# Patient Record
Sex: Male | Born: 1965
Health system: Southern US, Community
[De-identification: ages and names within clinical notes are randomized; demographics above are authoritative.]

## PROBLEM LIST (undated history)

## (undated) DIAGNOSIS — E785 Hyperlipidemia, unspecified: Secondary | ICD-10-CM

## (undated) DIAGNOSIS — I10 Essential (primary) hypertension: Secondary | ICD-10-CM

---

## 2003-08-17 ENCOUNTER — Emergency Department (HOSPITAL_COMMUNITY): Admission: AD | Admit: 2003-08-17 | Discharge: 2003-08-17 | Payer: Self-pay

## 2004-08-06 ENCOUNTER — Emergency Department (HOSPITAL_COMMUNITY): Admission: EM | Admit: 2004-08-06 | Discharge: 2004-08-07 | Payer: Self-pay | Admitting: Emergency Medicine

## 2005-06-29 ENCOUNTER — Emergency Department (HOSPITAL_COMMUNITY): Admission: EM | Admit: 2005-06-29 | Discharge: 2005-06-29 | Payer: Self-pay | Admitting: Emergency Medicine

## 2005-09-17 ENCOUNTER — Emergency Department (HOSPITAL_COMMUNITY): Admission: EM | Admit: 2005-09-17 | Discharge: 2005-09-17 | Payer: Self-pay | Admitting: Emergency Medicine

## 2005-09-22 ENCOUNTER — Emergency Department (HOSPITAL_COMMUNITY): Admission: EM | Admit: 2005-09-22 | Discharge: 2005-09-22 | Payer: Self-pay | Admitting: Family Medicine

## 2005-09-26 ENCOUNTER — Emergency Department (HOSPITAL_COMMUNITY): Admission: EM | Admit: 2005-09-26 | Discharge: 2005-09-26 | Payer: Self-pay | Admitting: Family Medicine

## 2006-09-27 ENCOUNTER — Emergency Department (HOSPITAL_COMMUNITY): Admission: EM | Admit: 2006-09-27 | Discharge: 2006-09-27 | Payer: Self-pay | Admitting: Emergency Medicine

## 2009-11-23 ENCOUNTER — Emergency Department (HOSPITAL_COMMUNITY): Admission: EM | Admit: 2009-11-23 | Discharge: 2009-11-23 | Payer: Self-pay | Admitting: Emergency Medicine

## 2010-02-15 ENCOUNTER — Encounter: Admission: RE | Admit: 2010-02-15 | Discharge: 2010-03-09 | Payer: Self-pay | Admitting: *Deleted

## 2011-06-29 ENCOUNTER — Inpatient Hospital Stay (INDEPENDENT_AMBULATORY_CARE_PROVIDER_SITE_OTHER)
Admission: RE | Admit: 2011-06-29 | Discharge: 2011-06-29 | Disposition: A | Discharge status: Home / Self Care | Payer: No Typology Code available for payment source | Source: Ambulatory Visit | Attending: Emergency Medicine | Admitting: Emergency Medicine

## 2011-06-29 ENCOUNTER — Ambulatory Visit (INDEPENDENT_AMBULATORY_CARE_PROVIDER_SITE_OTHER): Payer: No Typology Code available for payment source

## 2011-06-29 DIAGNOSIS — R071 Chest pain on breathing: Secondary | ICD-10-CM

## 2011-10-08 ENCOUNTER — Emergency Department (HOSPITAL_COMMUNITY): Payer: Worker's Compensation

## 2011-10-08 ENCOUNTER — Emergency Department (HOSPITAL_COMMUNITY)
Admission: EM | Admit: 2011-10-08 | Discharge: 2011-10-08 | Disposition: A | Discharge status: Home / Self Care | Payer: Worker's Compensation | Attending: Emergency Medicine | Admitting: Emergency Medicine

## 2011-10-08 ENCOUNTER — Encounter (HOSPITAL_COMMUNITY): Payer: Self-pay | Admitting: *Deleted

## 2011-10-08 DIAGNOSIS — Z79899 Other long term (current) drug therapy: Secondary | ICD-10-CM | POA: Insufficient documentation

## 2011-10-08 DIAGNOSIS — M545 Low back pain, unspecified: Secondary | ICD-10-CM | POA: Insufficient documentation

## 2011-10-08 DIAGNOSIS — S20229A Contusion of unspecified back wall of thorax, initial encounter: Secondary | ICD-10-CM | POA: Insufficient documentation

## 2011-10-08 DIAGNOSIS — W11XXXA Fall on and from ladder, initial encounter: Secondary | ICD-10-CM | POA: Insufficient documentation

## 2011-10-08 DIAGNOSIS — M533 Sacrococcygeal disorders, not elsewhere classified: Secondary | ICD-10-CM | POA: Insufficient documentation

## 2011-10-08 DIAGNOSIS — I10 Essential (primary) hypertension: Secondary | ICD-10-CM | POA: Insufficient documentation

## 2011-10-08 DIAGNOSIS — S300XXA Contusion of lower back and pelvis, initial encounter: Secondary | ICD-10-CM

## 2011-10-08 DIAGNOSIS — E119 Type 2 diabetes mellitus without complications: Secondary | ICD-10-CM | POA: Insufficient documentation

## 2011-10-08 HISTORY — DX: Essential (primary) hypertension: I10

## 2011-10-08 MED ORDER — IBUPROFEN 200 MG PO TABS
400.0000 mg | ORAL_TABLET | Freq: Once | ORAL | Status: AC
  Administered 2011-10-08: 400 mg via ORAL
  Filled 2011-10-08: qty 2

## 2011-10-08 NOTE — ED Notes (Signed)
Pt fell at work off a train. Tailbone landed on hard metal. This happened today at 1400. 7/10 pain. Sitting decreases pain. Pt fell 3 ft.

## 2011-10-08 NOTE — ED Notes (Signed)
He fell from the trian at work this afternoon and he now has tailbone pain

## 2011-10-08 NOTE — ED Provider Notes (Signed)
History    46 year old male with lower back pain. The patient was stepping down from a ladder off a train when he slipped and fell onto his back. Fell onto his buttock and lower back. Persistent pain in this area since. Has been ambulatory Pain is worse with ambulation. No numbness, weakness or tingling. No bladder or bowel retention or incontinence. Did not hit head. Denies headache. No neck pain. No other complaints.  CSN: [id number]  Arrival date & time 10/08/11  1609   First MD Initiated Contact with Patient 10/08/11 1715      Chief Complaint  Patient presents with  . Fall    (Consider location/radiation/quality/duration/timing/severity/associated sxs/prior treatment) HPI  Past Medical History  Diagnosis Date  . Hypertension   . Diabetes mellitus     History reviewed. No pertinent past surgical history.  History reviewed. No pertinent family history.  History  Substance Use Topics  . Smoking status: Never Smoker   . Smokeless tobacco: Not on file  . Alcohol Use: Yes      Review of Systems   Review of symptoms negative unless otherwise noted in HPI.   Allergies  Review of patient's allergies indicates no known allergies.  Home Medications   Current Outpatient Rx  Name Route Sig Dispense Refill  . LISINOPRIL-HYDROCHLOROTHIAZIDE 20-25 MG PO TABS Oral Take 1 tablet by mouth daily.    Marland Kitchen METFORMIN HCL ER (MOD) 500 MG PO TB24 Oral Take 500 mg by mouth daily with breakfast.    . SIMVASTATIN 20 MG PO TABS Oral Take 20 mg by mouth daily.      BP 133/93  Pulse 81  Temp(Src) 98.1 F (36.7 C) (Oral)  Resp 18  SpO2 96%  Physical Exam  Nursing note and vitals reviewed. Constitutional: He is oriented to person, place, and time. He appears well-developed and well-nourished. No distress.       Sitting in chair. No acute distress  HENT:  Head: Normocephalic and atraumatic.  Eyes: Conjunctivae are normal. Right eye exhibits no discharge. Left eye exhibits no  discharge.  Neck: Neck supple.  Cardiovascular: Normal rate, regular rhythm and normal heart sounds.  Exam reveals no gallop and no friction rub.   No murmur heard. Pulmonary/Chest: Effort normal and breath sounds normal. No respiratory distress.  Abdominal: Soft. He exhibits no distension. There is no tenderness.  Musculoskeletal: He exhibits tenderness. He exhibits no edema.       Mild-to-moderate lower lumbar and sacral tenderness.  Neurological: He is alert and oriented to person, place, and time. No cranial nerve deficit. He exhibits normal muscle tone. Coordination normal.       Normal-appearing gait. Deep tendon reflexes of the patellar normal bilaterally.  Skin: Skin is warm and dry.  Psychiatric: He has a normal mood and affect. His behavior is normal. Thought content normal.    ED Course  Procedures (including critical care time)  Labs Reviewed - No data to display Dg Lumbar Spine Complete  10/08/2011  *RADIOLOGY REPORT*  Clinical Data: Fall from a train.  Back pain.  LUMBAR SPINE - COMPLETE 4+ VIEW  Comparison: None.  Findings: Five non-rib bearing lumbar type vertebral bodies are present.  The vertebral body heights are maintained.  Loss of disc height at L5-S1 is chronic with slight retrolisthesis.  No acute fracture or traumatic subluxation is evident.  IMPRESSION:  1.  Mild chronic degenerative change at L5-S1. 2.  No acute abnormality.  Original Report Authenticated By: Jamesetta Orleans. MATTERN, M.D.  Dg Sacrum/coccyx  10/08/2011  *RADIOLOGY REPORT*  Clinical Data: Fall from a train.  Sacrum and coccyx pain.  SACRUM AND COCCYX - 2+ VIEW  Comparison: Lumbar radiographs from the same date.  Findings: Mild degenerative changes are again noted at L5-S1.  The sacrum is intact.  No acute abnormality is present.  IMPRESSION:  1.  No acute abnormality. 2.  Chronic degenerative changes at L5-S1.  Original Report Authenticated By: Jamesetta Orleans. MATTERN, M.D.     1. Contusion of lower  back       MDM  46 year old male with back pain status post fall. consider fracture, contusion or sprain. X-rays withno evidence of fracture. Neurological examination is nonfocal. Consistent with contusion. Plan symptomatic treatment. Outpatient follow up as needed.        Raeford Razor, MD 10/08/11 [phone]

## 2012-05-29 ENCOUNTER — Ambulatory Visit: Payer: Medicaid Other | Attending: Specialist | Admitting: Rehabilitative and Restorative Service Providers"

## 2012-05-29 DIAGNOSIS — M2569 Stiffness of other specified joint, not elsewhere classified: Secondary | ICD-10-CM | POA: Insufficient documentation

## 2012-05-29 DIAGNOSIS — M545 Low back pain, unspecified: Secondary | ICD-10-CM | POA: Insufficient documentation

## 2012-05-29 DIAGNOSIS — R293 Abnormal posture: Secondary | ICD-10-CM | POA: Insufficient documentation

## 2012-05-29 DIAGNOSIS — IMO0001 Reserved for inherently not codable concepts without codable children: Secondary | ICD-10-CM | POA: Insufficient documentation

## 2012-06-05 ENCOUNTER — Ambulatory Visit: Payer: Medicaid Other | Admitting: Rehabilitation

## 2012-06-10 ENCOUNTER — Ambulatory Visit: Payer: Medicaid Other | Attending: Specialist | Admitting: Rehabilitative and Restorative Service Providers"

## 2012-06-10 DIAGNOSIS — R293 Abnormal posture: Secondary | ICD-10-CM | POA: Insufficient documentation

## 2012-06-10 DIAGNOSIS — M545 Low back pain, unspecified: Secondary | ICD-10-CM | POA: Insufficient documentation

## 2012-06-10 DIAGNOSIS — IMO0001 Reserved for inherently not codable concepts without codable children: Secondary | ICD-10-CM | POA: Insufficient documentation

## 2012-06-10 DIAGNOSIS — M2569 Stiffness of other specified joint, not elsewhere classified: Secondary | ICD-10-CM | POA: Insufficient documentation

## 2012-06-18 ENCOUNTER — Ambulatory Visit: Payer: Medicaid Other | Admitting: Rehabilitative and Restorative Service Providers"

## 2012-12-22 ENCOUNTER — Emergency Department (HOSPITAL_COMMUNITY)
Admission: EM | Admit: 2012-12-22 | Discharge: 2012-12-23 | Disposition: A | Discharge status: Home / Self Care | Payer: Medicaid Other | Attending: Emergency Medicine | Admitting: Emergency Medicine

## 2012-12-22 ENCOUNTER — Encounter (HOSPITAL_COMMUNITY): Payer: Self-pay | Admitting: Emergency Medicine

## 2012-12-22 DIAGNOSIS — R739 Hyperglycemia, unspecified: Secondary | ICD-10-CM

## 2012-12-22 DIAGNOSIS — R748 Abnormal levels of other serum enzymes: Secondary | ICD-10-CM | POA: Insufficient documentation

## 2012-12-22 DIAGNOSIS — Z91199 Patient's noncompliance with other medical treatment and regimen due to unspecified reason: Secondary | ICD-10-CM | POA: Insufficient documentation

## 2012-12-22 DIAGNOSIS — E1169 Type 2 diabetes mellitus with other specified complication: Secondary | ICD-10-CM | POA: Insufficient documentation

## 2012-12-22 DIAGNOSIS — R631 Polydipsia: Secondary | ICD-10-CM | POA: Insufficient documentation

## 2012-12-22 DIAGNOSIS — Z79899 Other long term (current) drug therapy: Secondary | ICD-10-CM | POA: Insufficient documentation

## 2012-12-22 DIAGNOSIS — E669 Obesity, unspecified: Secondary | ICD-10-CM | POA: Insufficient documentation

## 2012-12-22 DIAGNOSIS — R358 Other polyuria: Secondary | ICD-10-CM | POA: Insufficient documentation

## 2012-12-22 DIAGNOSIS — R3589 Other polyuria: Secondary | ICD-10-CM | POA: Insufficient documentation

## 2012-12-22 DIAGNOSIS — I10 Essential (primary) hypertension: Secondary | ICD-10-CM | POA: Insufficient documentation

## 2012-12-22 DIAGNOSIS — Z9119 Patient's noncompliance with other medical treatment and regimen: Secondary | ICD-10-CM | POA: Insufficient documentation

## 2012-12-22 DIAGNOSIS — R632 Polyphagia: Secondary | ICD-10-CM | POA: Insufficient documentation

## 2012-12-22 LAB — COMPREHENSIVE METABOLIC PANEL
ALT: 124 U/L — ABNORMAL HIGH (ref 0–53)
AST: 140 U/L — ABNORMAL HIGH (ref 0–37)
Albumin: 3.8 g/dL (ref 3.5–5.2)
Alkaline Phosphatase: 46 U/L (ref 39–117)
BUN: 19 mg/dL (ref 6–23)
CO2: 20 mEq/L (ref 19–32)
Calcium: 9.5 mg/dL (ref 8.4–10.5)
Chloride: 91 mEq/L — ABNORMAL LOW (ref 96–112)
Creatinine, Ser: 0.7 mg/dL (ref 0.50–1.35)
GFR calc Af Amer: >90 mL/min (ref >90)
GFR calc non Af Amer: >90 mL/min (ref >90)
Glucose, Bld: 353 mg/dL — ABNORMAL HIGH (ref 70–99)
Potassium: 5 mEq/L (ref 3.5–5.1)
Sodium: 132 mEq/L — ABNORMAL LOW (ref 135–145)
Total Bilirubin: 0.7 mg/dL (ref 0.3–1.2)
Total Protein: 7.3 g/dL (ref 6.0–8.3)

## 2012-12-22 LAB — CBC WITH DIFFERENTIAL/PLATELET
Basophils Absolute: 0.1 10*3/uL (ref 0.0–0.1)
Basophils Relative: 1 % (ref 0–1)
Eosinophils Absolute: 0.1 10*3/uL (ref 0.0–0.7)
Eosinophils Relative: 1 % (ref 0–5)
HCT: 43.9 % (ref 39.0–52.0)
Hemoglobin: 16.6 g/dL (ref 13.0–17.0)
Lymphocytes Relative: 24 % (ref 12–46)
Lymphs Abs: 1.4 10*3/uL (ref 0.7–4.0)
MCH: 31 pg (ref 26.0–34.0)
MCHC: 37.8 g/dL — ABNORMAL HIGH (ref 30.0–36.0)
MCV: 81.9 fL (ref 78.0–100.0)
Monocytes Absolute: 0.6 10*3/uL (ref 0.1–1.0)
Monocytes Relative: 10 % (ref 3–12)
Neutro Abs: 3.9 10*3/uL (ref 1.7–7.7)
Neutrophils Relative %: 64 % (ref 43–77)
Platelets: 216 10*3/uL (ref 150–400)
RBC: 5.36 MIL/uL (ref 4.22–5.81)
RDW: 13.2 % (ref 11.5–15.5)
WBC: 6 10*3/uL (ref 4.0–10.5)

## 2012-12-22 LAB — GLUCOSE, CAPILLARY
Glucose-Capillary: 431 mg/dL — ABNORMAL HIGH (ref 70–99)
Glucose-Capillary: 316 mg/dL — ABNORMAL HIGH (ref 70–99)

## 2012-12-22 MED ORDER — GLYBURIDE 5 MG PO TABS: 5.0000 mg | ORAL_TABLET | Freq: Every day | ORAL | Status: DC

## 2012-12-22 MED ORDER — SODIUM CHLORIDE 0.9 % IV BOLUS (SEPSIS)
2000.0000 mL | Freq: Once | INTRAVENOUS | Status: AC
  Administered 2012-12-22: 2000 mL via INTRAVENOUS

## 2012-12-22 NOTE — ED Provider Notes (Signed)
History     CSN: [id number]  Arrival date & time 12/22/12  1742   First MD Initiated Contact with Patient 12/22/12 2133      Chief Complaint  Patient presents with  . Hyperglycemia    (Consider location/radiation/quality/duration/timing/severity/associated sxs/prior treatment) HPI   Vincent Mcintyre is a 47 y.o. male sent from his primary care physician's office for evaluation of elevated glucose. Patient went for a exam this morning had blood drawn and was found to have a glucose of over 600. Patient is noncompliant with his metformin which he has not had an ovary here because he's been unemployed and does not have money. Patient denies fever, chest pain, shortness of breath, abdominal pain, nausea vomiting, dysuria. Patient endorses polyuria, polydipsia, polyphagia.    Past Medical History  Diagnosis Date  . Hypertension   . Diabetes mellitus     History reviewed. No pertinent past surgical history.  History reviewed. No pertinent family history.  History  Substance Use Topics  . Smoking status: Never Smoker   . Smokeless tobacco: Not on file  . Alcohol Use: Yes      Review of Systems  Constitutional: Negative for fever.  Respiratory: Negative for shortness of breath.   Cardiovascular: Negative for chest pain.  Gastrointestinal: Negative for nausea, vomiting, abdominal pain and diarrhea.  Endocrine: Positive for polydipsia, polyphagia and polyuria.  All other systems reviewed and are negative.    Allergies  Review of patient's allergies indicates no known allergies.  Home Medications   Current Outpatient Rx  Name  Route  Sig  Dispense  Refill  . lisinopril-hydrochlorothiazide (PRINZIDE,ZESTORETIC) 20-25 MG per tablet   Oral   Take 1 tablet by mouth daily.         . metFORMIN (GLUMETZA) 500 MG (MOD) 24 hr tablet   Oral   Take 500 mg by mouth daily with breakfast.         . simvastatin (ZOCOR) 20 MG tablet   Oral   Take 20 mg by mouth daily.            BP 144/98  Pulse 110  Temp(Src) 98.7 F (37.1 C) (Oral)  Resp 20  SpO2 95%  Physical Exam  Nursing note and vitals reviewed. Constitutional: He is oriented to person, place, and time. He appears well-developed and well-nourished. No distress.  Obese   HENT:  Head: Normocephalic and atraumatic.  Mouth/Throat: Oropharynx is clear and moist.  Eyes: Conjunctivae and EOM are normal. Pupils are equal, round, and reactive to light.  Neck: Normal range of motion.  Cardiovascular: Normal rate, regular rhythm and intact distal pulses.   Pulmonary/Chest: Effort normal and breath sounds normal. No stridor. No respiratory distress. He has no wheezes. He has no rales. He exhibits no tenderness.  Abdominal: Soft. Bowel sounds are normal. He exhibits no distension and no mass. There is no tenderness. There is no rebound and no guarding.  Musculoskeletal: Normal range of motion.  Neurological: He is alert and oriented to person, place, and time.  Psychiatric: He has a normal mood and affect.    ED Course  Procedures (including critical care time)  Labs Reviewed  GLUCOSE, CAPILLARY - Abnormal; Notable for the following:    Glucose-Capillary 431 (*)    All other components within normal limits  CBC WITH DIFFERENTIAL - Abnormal; Notable for the following:    MCHC 37.8 (*)    All other components within normal limits  COMPREHENSIVE METABOLIC PANEL - Abnormal; Notable for the following:  Sodium 132 (*)    Chloride 91 (*)    Glucose, Bld 353 (*)    AST 140 (*)    ALT 124 (*)    All other components within normal limits  GLUCOSE, CAPILLARY - Abnormal; Notable for the following:    Glucose-Capillary 316 (*)    All other components within normal limits  CBC WITH DIFFERENTIAL   No results found.   1. Hyperglycemia without ketosis   2. Elevated liver function tests       MDM   Vincent Mcintyre is a 47 y.o. male sent from his primary care physician's office for elevated glucose.  CBG is 431 with no anion gap. Patient is given 2 L of normal saline and CBG reduces to 316. Patient has elevated LFTs at 140 and 124. For this reason I and taking him off of metformin and starting him on glipizide.  VSS, patient is appropriate for an amenable to discharge at this time.   Filed Vitals:   12/22/12 1817 12/23/12 0009  BP: 144/98 147/94  Pulse: 110 89  Temp: 98.7 F (37.1 C) 97.7 F (36.5 C)  TempSrc: Oral Oral  Resp: 20 18  SpO2: 95% 98%     Pt verbalized understanding and agrees with care plan. Outpatient follow-up and return precautions given.    Discharge Medication List as of 12/22/2012 11:44 PM    START taking these medications   Details  glyBURIDE (DIABETA) 5 MG tablet Take 1 tablet (5 mg total) by mouth daily with breakfast., Starting 12/22/2012, Until Discontinued, [employer], PA-C 12/25/12 [phone]

## 2012-12-22 NOTE — ED Notes (Signed)
Pt sent here from PCP for eval for hyperglycemia; pt with hx of DM and denies complaint

## 2012-12-23 NOTE — ED Notes (Signed)
Pt discharged.Vital signs stable and GCS 15 

## 2012-12-25 NOTE — ED Provider Notes (Signed)
Medical screening examination/treatment/procedure(s) were performed by non-physician practitioner and as supervising physician I was immediately available for consultation/collaboration.   Gwyneth Sprout, MD 12/25/12 [phone]

## 2013-04-22 ENCOUNTER — Other Ambulatory Visit: Payer: Self-pay | Admitting: Family Medicine

## 2013-04-22 DIAGNOSIS — M792 Neuralgia and neuritis, unspecified: Secondary | ICD-10-CM

## 2013-04-27 ENCOUNTER — Ambulatory Visit
Admission: RE | Admit: 2013-04-27 | Discharge: 2013-04-27 | Disposition: A | Discharge status: Home / Self Care | Payer: Medicaid Other | Source: Ambulatory Visit | Attending: Family Medicine | Admitting: Family Medicine

## 2013-04-27 DIAGNOSIS — M792 Neuralgia and neuritis, unspecified: Secondary | ICD-10-CM

## 2014-10-15 ENCOUNTER — Other Ambulatory Visit: Payer: Self-pay | Admitting: Neurological Surgery

## 2014-10-15 DIAGNOSIS — M545 Low back pain: Secondary | ICD-10-CM

## 2014-10-25 ENCOUNTER — Other Ambulatory Visit: Payer: Medicaid Other

## 2014-10-28 ENCOUNTER — Ambulatory Visit
Admission: RE | Admit: 2014-10-28 | Discharge: 2014-10-28 | Disposition: A | Discharge status: Home / Self Care | Payer: Medicaid Other | Source: Ambulatory Visit | Attending: Neurological Surgery | Admitting: Neurological Surgery

## 2014-10-28 DIAGNOSIS — M545 Low back pain: Secondary | ICD-10-CM

## 2014-11-25 ENCOUNTER — Encounter (HOSPITAL_COMMUNITY): Payer: Self-pay | Admitting: Emergency Medicine

## 2014-11-25 ENCOUNTER — Emergency Department (HOSPITAL_COMMUNITY)
Admission: EM | Admit: 2014-11-25 | Discharge: 2014-11-26 | Disposition: A | Discharge status: Home / Self Care | Payer: Medicaid Other | Attending: Emergency Medicine | Admitting: Emergency Medicine

## 2014-11-25 ENCOUNTER — Emergency Department (HOSPITAL_COMMUNITY): Payer: Medicaid Other

## 2014-11-25 DIAGNOSIS — I10 Essential (primary) hypertension: Secondary | ICD-10-CM | POA: Insufficient documentation

## 2014-11-25 DIAGNOSIS — K602 Anal fissure, unspecified: Secondary | ICD-10-CM | POA: Insufficient documentation

## 2014-11-25 DIAGNOSIS — Z79899 Other long term (current) drug therapy: Secondary | ICD-10-CM | POA: Diagnosis not present

## 2014-11-25 DIAGNOSIS — R195 Other fecal abnormalities: Secondary | ICD-10-CM

## 2014-11-25 DIAGNOSIS — E785 Hyperlipidemia, unspecified: Secondary | ICD-10-CM | POA: Diagnosis not present

## 2014-11-25 DIAGNOSIS — K921 Melena: Secondary | ICD-10-CM | POA: Diagnosis not present

## 2014-11-25 DIAGNOSIS — E119 Type 2 diabetes mellitus without complications: Secondary | ICD-10-CM | POA: Diagnosis not present

## 2014-11-25 DIAGNOSIS — K6289 Other specified diseases of anus and rectum: Secondary | ICD-10-CM

## 2014-11-25 LAB — CBC WITH DIFFERENTIAL/PLATELET
Basophils Absolute: 0 10*3/uL (ref 0.0–0.1)
Basophils Relative: 0 % (ref 0–1)
Eosinophils Absolute: 0 10*3/uL (ref 0.0–0.7)
Eosinophils Relative: 0 % (ref 0–5)
HCT: 45.2 % (ref 39.0–52.0)
HEMOGLOBIN: 16.3 g/dL (ref 13.0–17.0)
Lymphocytes Relative: 15 % (ref 12–46)
Lymphs Abs: 1.9 10*3/uL (ref 0.7–4.0)
MCH: 30 pg (ref 26.0–34.0)
MCHC: 36.1 g/dL — AB (ref 30.0–36.0)
MCV: 83.2 fL (ref 78.0–100.0)
MONO ABS: 1 10*3/uL (ref 0.1–1.0)
MONOS PCT: 8 % (ref 3–12)
NEUTROS PCT: 77 % (ref 43–77)
Neutro Abs: 9.8 10*3/uL — ABNORMAL HIGH (ref 1.7–7.7)
PLATELETS: 214 10*3/uL (ref 150–400)
RBC: 5.43 MIL/uL (ref 4.22–5.81)
RDW: 13 % (ref 11.5–15.5)
WBC: 12.7 10*3/uL — ABNORMAL HIGH (ref 4.0–10.5)

## 2014-11-25 LAB — COMPREHENSIVE METABOLIC PANEL
ALT: 47 U/L (ref 0–53)
ANION GAP: 11 (ref 5–15)
AST: 35 U/L (ref 0–37)
Albumin: 3.9 g/dL (ref 3.5–5.2)
Alkaline Phosphatase: 44 U/L (ref 39–117)
BUN: 25 mg/dL — ABNORMAL HIGH (ref 6–23)
CALCIUM: 9.3 mg/dL (ref 8.4–10.5)
CO2: 26 mmol/L (ref 19–32)
Chloride: 96 mmol/L (ref 96–112)
Creatinine, Ser: 1.1 mg/dL (ref 0.50–1.35)
GFR calc Af Amer: >90 mL/min (ref >90)
GFR, EST NON AFRICAN AMERICAN: 78 mL/min — AB (ref >90)
GLUCOSE: 309 mg/dL — AB (ref 70–99)
Potassium: 4 mmol/L (ref 3.5–5.1)
Sodium: 133 mmol/L — ABNORMAL LOW (ref 135–145)
Total Bilirubin: 1.4 mg/dL — ABNORMAL HIGH (ref 0.3–1.2)
Total Protein: 6.9 g/dL (ref 6.0–8.3)

## 2014-11-25 LAB — POC OCCULT BLOOD, ED: Fecal Occult Bld: POSITIVE — AB

## 2014-11-25 MED ORDER — HYDROCODONE-ACETAMINOPHEN 5-325 MG PO TABS
1.0000 | ORAL_TABLET | Freq: Once | ORAL | Status: AC
  Administered 2014-11-25: 1 via ORAL
  Filled 2014-11-25: qty 1

## 2014-11-25 MED ORDER — IOHEXOL 300 MG/ML  SOLN
25.0000 mL | INTRAMUSCULAR | Status: AC
  Administered 2014-11-25: 25 mL via ORAL

## 2014-11-25 MED ORDER — SODIUM CHLORIDE 0.9 % IV BOLUS (SEPSIS)
1000.0000 mL | Freq: Once | INTRAVENOUS | Status: AC
  Administered 2014-11-25: 1000 mL via INTRAVENOUS

## 2014-11-25 MED ORDER — IOHEXOL 300 MG/ML  SOLN
100.0000 mL | Freq: Once | INTRAMUSCULAR | Status: AC | PRN
  Administered 2014-11-25: 100 mL via INTRAVENOUS

## 2014-11-25 NOTE — ED Notes (Signed)
Denies any large amount of blood in stool, but states some pink at times.

## 2014-11-25 NOTE — ED Provider Notes (Signed)
CSN: [id number]     Arrival date & time 11/25/14  1914 History   First MD Initiated Contact with Patient 11/25/14 2143     Chief Complaint  Patient presents with  . Abscess  . Rectal Pain     (Consider location/radiation/quality/duration/timing/severity/associated sxs/prior Treatment) HPI  Vincent Mcintyre is a 49 y.o. male with PMH of hypertension, hyperlipidemia, diabetes presenting with 2-3 day history of rectal pain with defecation as well as some pink per rectum but no frank blood or melanotic stool. Patient was seen by [facility] physicians advised to come to the ED to rule out perirectal abscess. Patient endorses chills and has low-grade temp of 99.8 denies any nausea or vomiting. No abdominal pain. He has been using fiber supplementation as well as sitz bath with some improvement as well as Aleve. No CP or SOB.   Past Medical History  Diagnosis Date  . Hypertension   . Diabetes mellitus    History reviewed. No pertinent past surgical history. History reviewed. No pertinent family history. History  Substance Use Topics  . Smoking status: Never Smoker   . Smokeless tobacco: Not on file  . Alcohol Use: Yes    Review of Systems 10 Systems reviewed and are negative for acute change except as noted in the HPI.    Allergies  Review of patient's allergies indicates no known allergies.  Home Medications   Prior to Admission medications   Medication Sig Start Date End Date Taking? Authorizing Provider  glyBURIDE (DIABETA) 5 MG tablet Take 1 tablet (5 mg total) by mouth daily with breakfast. 12/22/12  Yes Nicole Pisciotta, PA-C  HYDROcodone-acetaminophen (NORCO/VICODIN) 5-325 MG per tablet Take 1 tablet by mouth every 6 (six) hours as needed for moderate pain.   Yes Historical Provider, MD  lisinopril-hydrochlorothiazide (PRINZIDE,ZESTORETIC) 20-25 MG per tablet Take 1 tablet by mouth daily. 11/10/14  Yes Historical Provider, MD  metFORMIN (GLUCOPHAGE) 500 MG tablet Take 1,000 mg by  mouth 2 (two) times daily with a meal.   Yes Historical Provider, MD  naproxen (NAPROSYN) 375 MG tablet Take 375 mg by mouth every evening.   Yes Historical Provider, MD  naproxen sodium (ANAPROX) 220 MG tablet Take 220 mg by mouth daily as needed (for pain).   Yes Historical Provider, MD  simvastatin (ZOCOR) 40 MG tablet Take 40 mg by mouth every morning. 10/17/14  Yes Historical Provider, MD   BP 128/90 mmHg  Pulse 115  Temp(Src) 99.2 F (37.3 C) (Oral)  Resp 19  Ht 5\' 10"  (1.778 m)  Wt 280 lb (127.007 kg)  BMI 40.18 kg/m2  SpO2 97% Physical Exam  Constitutional: He appears well-developed and well-nourished. No distress.  HENT:  Head: Normocephalic and atraumatic.  Dry mucous membrane  Eyes: Conjunctivae and EOM are normal. Right eye exhibits no discharge. Left eye exhibits no discharge.  Cardiovascular: Normal rate and regular rhythm.   Pulmonary/Chest: Effort normal and breath sounds normal. No respiratory distress. He has no wheezes.  Abdominal: Soft. Bowel sounds are normal. He exhibits no distension. There is no tenderness.  Genitourinary:  External rectum tenderness to palpation with visible fissure at 6'oclock. Internal rectum with normal tone no discrete mass or abscess felt. Light brown stool. Pain with rectal exam. Nursing tech in room during exam.   Neurological: He is alert. He exhibits normal muscle tone. Coordination normal.  Skin: Skin is warm and dry. He is not diaphoretic.  Nursing note and vitals reviewed.   ED Course  Procedures (including critical care time)  Labs Review Labs Reviewed  CBC WITH DIFFERENTIAL/PLATELET - Abnormal; Notable for the following:    WBC 12.7 (*)    MCHC 36.1 (*)    Neutro Abs 9.8 (*)    All other components within normal limits  COMPREHENSIVE METABOLIC PANEL - Abnormal; Notable for the following:    Sodium 133 (*)    Glucose, Bld 309 (*)    BUN 25 (*)    Total Bilirubin 1.4 (*)    GFR calc non Af Amer 78 (*)    All other  components within normal limits  POC OCCULT BLOOD, ED - Abnormal; Notable for the following:    Fecal Occult Bld POSITIVE (*)    All other components within normal limits    Imaging Review No results found.   EKG Interpretation None      MDM   Final diagnoses:  Rectal fissure  Occult blood in stools   Pt presenting with rectal pain as well as subjective fevers, chills. Pt sent from primary care with concern for perirectal abscess. Pt with tachycardia no fevers in ED. Bolus fluids provided. Rectal exam with external fissure. No mass appreciated. Pt fecal occult positive. Mild leukocytosis. New elevated bilirubin 1.4. No anemia and I doubt acute GI bleed. CT abdomen pelvis ordered to rule out abscess.  Pt signed out to [employer] at shift change.  Plan: if negative CT and improving tachycardia discharge home with sitz bath, metamucil, symptomatic treatment for fissure and follow up with PCP. Otherwise dispo accordingly.     Al Corpus, PA-C 11/26/14 [place], PA-C 20/10/07 1219  Delora Fuel, MD [id number] [phone]

## 2014-11-25 NOTE — ED Notes (Signed)
Patient here with complaint of rectal pain secondary to abscess. Was seen by MD and advised to come to ED for possible perirectal abscess.

## 2014-11-25 NOTE — ED Notes (Signed)
PA Creech in room with this RN to collect Occult stool

## 2014-11-26 NOTE — Discharge Instructions (Signed)
RECOMMEND TUCKS MEDICATED PADS AS DIRECTED FOR RECTAL FISSURES. FOLLOW UP WITH GI (DR. Carlean Purl) FOR EVALUATION OF IMPROVEMENT AND FOR FINDING OF BLOOD IN YOUR STOOLS. RETURN HERE AS NEEDED.  Anal Fissure, Adult An anal fissure is a small tear or crack in the skin around the anus. Bleeding from a fissure usually stops on its own within a few minutes. However, bleeding will often reoccur with each bowel movement until the crack heals.  CAUSES   Passing large, hard stools.  Frequent diarrheal stools.  Constipation.  Inflammatory bowel disease (Crohn's disease or ulcerative colitis).  Infections.  Anal sex. SYMPTOMS   Small amounts of blood seen on your stools, on toilet paper, or in the toilet after a bowel movement.  Rectal bleeding.  Painful bowel movements.  Itching or irritation around the anus. DIAGNOSIS Your caregiver will examine the anal area. An anal fissure can usually be seen with careful inspection. A rectal exam may be performed and a short tube (anoscope) may be used to examine the anal canal. TREATMENT   You may be instructed to take fiber supplements. These supplements can soften your stool to help make bowel movements easier.  Sitz baths may be recommended to help heal the tear. Do not use soap in the sitz baths.  A medicated cream or ointment may be prescribed to lessen discomfort. HOME CARE INSTRUCTIONS   Maintain a diet high in fruits, whole grains, and vegetables. Avoid constipating foods like bananas and dairy products.  Take sitz baths as directed by your caregiver.  Drink enough fluids to keep your urine clear or pale yellow.  Only take over-the-counter or prescription medicines for pain, discomfort, or fever as directed by your caregiver. Do not take aspirin as this may increase bleeding.  Do not use ointments containing numbing medications (anesthetics) or hydrocortisone. They could slow healing. SEEK MEDICAL CARE IF:   Your fissure is not  completely healed within 3 days.  You have further bleeding.  You have a fever.  You have diarrhea mixed with blood.  You have pain.  Your problem is getting worse rather than better. MAKE SURE YOU:   Understand these instructions.  Will watch your condition.  Will get help right away if you are not doing well or get worse. Document Released: 08/27/2005 Document Revised: 11/19/2011 Document Reviewed: 02/11/2011 [facility] Patient Information 2015 [place], [place]. This information is not intended to replace advice given to you by your health care provider. Make sure you discuss any questions you have with your health care provider.  Bloody Stools Bloody stools often mean that there is a problem in the digestive tract. Your caregiver may use the term "melena" to describe black, tarry, and bad smelling stools or "hematochezia" to describe red or maroon-colored stools. Blood seen in the stool can be caused by bleeding anywhere along the intestinal tract.  A black stool usually means that blood is coming from the upper part of the gastrointestinal tract (esophagus, stomach, or small bowel). Passing maroon-colored stools or bright red blood usually means that blood is coming from lower down in the large bowel or the rectum. However, sometimes massive bleeding in the stomach or small intestine can cause bright red bloody stools.  Consuming black licorice, lead, iron pills, medicines containing bismuth subsalicylate, or blueberries can also cause black stools. Your caregiver can test black stools to see if blood is present. It is important that the cause of the bleeding be found. Treatment can then be started, and the problem can  be corrected. Rectal bleeding may not be serious, but you should not assume everything is okay until you know the cause.It is very important to follow up with your caregiver or a specialist in gastrointestinal problems. CAUSES  Blood in the stools can come from various  underlying causes.Often, the cause is not found during your first visit. Testing is often needed to discover the cause of bleeding in the gastrointestinal tract. Causes range from simple to serious or even life-threatening.Possible causes include:  Hemorrhoids.These are veins that are full of blood (engorged) in the rectum. They cause pain, inflammation, and may bleed.  Anal fissures.These are areas of painful tearing which may bleed. They are often caused by passing hard stool.  Diverticulosis.These are pouches that form on the colon over time, with age, and may bleed significantly.  Diverticulitis.This is inflammation in areas with diverticulosis. It can cause pain, fever, and bloody stools, although bleeding is rare.  Proctitis and colitis. These are inflamed areas of the rectum or colon. They may cause pain, fever, and bloody stools.  Polyps and cancer. Colon cancer is a leading cause of preventable cancer death.It often starts out as precancerous polyps that can be removed during a colonoscopy, preventing progression into cancer. Sometimes, polyps and cancer may cause rectal bleeding.  Gastritis and ulcers.Bleeding from the upper gastrointestinal tract (near the stomach) may travel through the intestines and produce black, sometimes tarry, often bad smelling stools. In certain cases, if the bleeding is fast enough, the stools may not be black, but red and the condition may be life-threatening. SYMPTOMS  You may have stools that are bright red and bloody, that are normal color with blood on them, or that are dark black and tarry. In some cases, you may only have blood in the toilet bowl. Any of these cases need medical care. You may also have:  Pain at the anus or anywhere in the rectum.  Lightheadedness or feeling faint.  Extreme weakness.  Nausea or vomiting.  Fever. DIAGNOSIS Your caregiver may use the following methods to find the cause of your bleeding:  Taking a  medical history. Age is important. Older people tend to develop polyps and cancer more often. If there is anal pain and a hard, large stool associated with bleeding, a tear of the anus may be the cause. If blood drips into the toilet after a bowel movement, bleeding hemorrhoids may be the problem. The color and frequency of the bleeding are additional considerations. In most cases, the medical history provides clues, but seldom the final answer.  A visual and finger (digital) exam. Your caregiver will inspect the anal area, looking for tears and hemorrhoids. A finger exam can provide information when there is tenderness or a growth inside. In men, the prostate is also examined.  Endoscopy. Several types of small, long scopes (endoscopes) are used to view the colon.  In the office, your caregiver may use a rigid, or more commonly, a flexible viewing sigmoidoscope. This exam is called flexible sigmoidoscopy. It is performed in 5 to 10 minutes.  A more thorough exam is accomplished with a colonoscope. It allows your caregiver to view the entire 5 to 6 foot long colon. Medicine to help you relax (sedative) is usually given for this exam. Frequently, a bleeding lesion may be present beyond the reach of the sigmoidoscope. So, a colonoscopy may be the best exam to start with. Both exams are usually done on an outpatient basis. This means the patient does not stay overnight  in the hospital or surgery center.  An upper endoscopy may be needed to examine your stomach. Sedation is used and a flexible endoscope is put in your mouth, down to your stomach.  A barium enema X-ray. This is an X-ray exam. It uses liquid barium inserted by enema into the rectum. This test alone may not identify an actual bleeding point. X-rays highlight abnormal shadows, such as those made by lumps (tumors), diverticuli, or colitis. TREATMENT  Treatment depends on the cause of your bleeding.   For bleeding from the stomach or colon,  the caregiver doing your endoscopy or colonoscopy may be able to stop the bleeding as part of the procedure.  Inflammation or infection of the colon can be treated with medicines.  Many rectal problems can be treated with creams, suppositories, or warm baths.  Surgery is sometimes needed.  Blood transfusions are sometimes needed if you have lost a lot of blood.  For any bleeding problem, let your caregiver know if you take aspirin or other blood thinners regularly. HOME CARE INSTRUCTIONS   Take any medicines exactly as prescribed.  Keep your stools soft by eating a diet high in fiber. Prunes (1 to 3 a day) work well for many people.  Drink enough water and fluids to keep your urine clear or pale yellow.  Take sitz baths if advised. A sitz bath is when you sit in a bathtub with warm water for 10 to 15 minutes to soak, soothe, and cleanse the rectal area.  If enemas or suppositories are advised, be sure you know how to use them. Tell your caregiver if you have problems with this.  Monitor your bowel movements to look for signs of improvement or worsening. SEEK MEDICAL CARE IF:   You do not improve in the time expected.  Your condition worsens after initial improvement.  You develop any new symptoms. SEEK IMMEDIATE MEDICAL CARE IF:   You develop severe or prolonged rectal bleeding.  You vomit blood.  You feel weak or faint.  You have a fever. MAKE SURE YOU:  Understand these instructions.  Will watch your condition.  Will get help right away if you are not doing well or get worse. Document Released: 08/17/2002 Document Revised: 11/19/2011 Document Reviewed: 01/12/2011 [facility] Patient Information 2015 [place], [place]. This information is not intended to replace advice given to you by your health care provider. Make sure you discuss any questions you have with your health care provider.

## 2014-11-26 NOTE — ED Provider Notes (Signed)
rectal pain H/o dm CT pending  - fluids for tachycardia Plan: if CT neg for perirectal abscess and tachycardia improves with fluids, ok to d/ch home, dx fissures, follow up GI  Pain well controlled on re-evaluation. Negative CT scan for perirectal abscess. Ready for d/ch home with recommendation for PCP and/or GI follow up.  Charlann Lange, PA-C 11/26/14 0327  Daleen Bo, MD 11/26/14 [phone]

## 2014-11-29 ENCOUNTER — Encounter (HOSPITAL_COMMUNITY): Admission: EM | Disposition: A | Discharge status: Home / Self Care | Payer: Self-pay | Source: Home / Self Care

## 2014-11-29 ENCOUNTER — Observation Stay (HOSPITAL_COMMUNITY): Payer: Medicaid Other | Admitting: Anesthesiology

## 2014-11-29 ENCOUNTER — Encounter (HOSPITAL_COMMUNITY): Payer: Self-pay | Admitting: General Practice

## 2014-11-29 ENCOUNTER — Inpatient Hospital Stay (HOSPITAL_COMMUNITY)
Admission: EM | Admit: 2014-11-29 | Discharge: 2014-12-01 | DRG: 982 | Disposition: A | Discharge status: Home / Self Care | Payer: Medicaid Other | Attending: General Surgery | Admitting: General Surgery

## 2014-11-29 DIAGNOSIS — E1165 Type 2 diabetes mellitus with hyperglycemia: Secondary | ICD-10-CM | POA: Diagnosis present

## 2014-11-29 DIAGNOSIS — L0390 Cellulitis, unspecified: Secondary | ICD-10-CM | POA: Diagnosis present

## 2014-11-29 DIAGNOSIS — I10 Essential (primary) hypertension: Secondary | ICD-10-CM | POA: Diagnosis present

## 2014-11-29 DIAGNOSIS — K611 Rectal abscess: Secondary | ICD-10-CM

## 2014-11-29 DIAGNOSIS — E785 Hyperlipidemia, unspecified: Secondary | ICD-10-CM | POA: Diagnosis present

## 2014-11-29 DIAGNOSIS — IMO0002 Reserved for concepts with insufficient information to code with codable children: Secondary | ICD-10-CM | POA: Diagnosis present

## 2014-11-29 DIAGNOSIS — Z6841 Body Mass Index (BMI) 40.0 and over, adult: Secondary | ICD-10-CM

## 2014-11-29 HISTORY — PX: INCISION AND DRAINAGE PERIRECTAL ABSCESS: SHX1804

## 2014-11-29 HISTORY — DX: Hyperlipidemia, unspecified: E78.5

## 2014-11-29 LAB — GLUCOSE, CAPILLARY
GLUCOSE-CAPILLARY: 198 mg/dL — AB (ref 70–99)
GLUCOSE-CAPILLARY: 266 mg/dL — AB (ref 70–99)
GLUCOSE-CAPILLARY: 315 mg/dL — AB (ref 70–99)

## 2014-11-29 LAB — COMPREHENSIVE METABOLIC PANEL
ALT: 33 U/L (ref 0–53)
AST: 23 U/L (ref 0–37)
Albumin: 3.3 g/dL — ABNORMAL LOW (ref 3.5–5.2)
Alkaline Phosphatase: 48 U/L (ref 39–117)
Anion gap: 9 (ref 5–15)
BILIRUBIN TOTAL: 1 mg/dL (ref 0.3–1.2)
BUN: 14 mg/dL (ref 6–23)
CHLORIDE: 101 mmol/L (ref 96–112)
CO2: 27 mmol/L (ref 19–32)
Calcium: 8.8 mg/dL (ref 8.4–10.5)
Creatinine, Ser: 0.76 mg/dL (ref 0.50–1.35)
Glucose, Bld: 239 mg/dL — ABNORMAL HIGH (ref 70–99)
Potassium: 3.8 mmol/L (ref 3.5–5.1)
Sodium: 137 mmol/L (ref 135–145)
Total Protein: 6.3 g/dL (ref 6.0–8.3)

## 2014-11-29 LAB — CBC
HEMATOCRIT: 40.2 % (ref 39.0–52.0)
Hemoglobin: 14.1 g/dL (ref 13.0–17.0)
MCH: 29.7 pg (ref 26.0–34.0)
MCHC: 35.1 g/dL (ref 30.0–36.0)
MCV: 84.6 fL (ref 78.0–100.0)
Platelets: 197 10*3/uL (ref 150–400)
RBC: 4.75 MIL/uL (ref 4.22–5.81)
RDW: 12.8 % (ref 11.5–15.5)
WBC: 9.7 10*3/uL (ref 4.0–10.5)

## 2014-11-29 LAB — CBG MONITORING, ED: GLUCOSE-CAPILLARY: 226 mg/dL — AB (ref 70–99)

## 2014-11-29 SURGERY — INCISION AND DRAINAGE, ABSCESS, PERIRECTAL
Anesthesia: General

## 2014-11-29 MED ORDER — HYDROMORPHONE HCL 1 MG/ML IJ SOLN
INTRAMUSCULAR | Status: AC
  Filled 2014-11-29: qty 1

## 2014-11-29 MED ORDER — PNEUMOCOCCAL VAC POLYVALENT 25 MCG/0.5ML IJ INJ
0.5000 mL | INJECTION | INTRAMUSCULAR | Status: AC
  Administered 2014-11-30: 0.5 mL via INTRAMUSCULAR
  Filled 2014-11-29: qty 0.5

## 2014-11-29 MED ORDER — LIDOCAINE HCL 4 % MT SOLN
OROMUCOSAL | Status: DC | PRN
  Administered 2014-11-29: 4 mL via TOPICAL

## 2014-11-29 MED ORDER — DIPHENHYDRAMINE HCL 12.5 MG/5ML PO ELIX: 12.5000 mg | ORAL_SOLUTION | Freq: Four times a day (QID) | ORAL | Status: DC | PRN

## 2014-11-29 MED ORDER — POTASSIUM CHLORIDE IN NACL 20-0.9 MEQ/L-% IV SOLN
INTRAVENOUS | Status: DC
  Administered 2014-11-29: 19:00:00 via INTRAVENOUS
  Filled 2014-11-29 (×5): qty 1000

## 2014-11-29 MED ORDER — PROPOFOL 10 MG/ML IV BOLUS
INTRAVENOUS | Status: AC
Start: 2014-11-29 — End: 2014-11-29
  Filled 2014-11-29: qty 20

## 2014-11-29 MED ORDER — FENTANYL CITRATE 0.05 MG/ML IJ SOLN
INTRAMUSCULAR | Status: AC
  Filled 2014-11-29: qty 5

## 2014-11-29 MED ORDER — LIDOCAINE HCL (CARDIAC) 20 MG/ML IV SOLN
INTRAVENOUS | Status: AC
  Filled 2014-11-29: qty 10

## 2014-11-29 MED ORDER — ROCURONIUM BROMIDE 50 MG/5ML IV SOLN
INTRAVENOUS | Status: AC
  Filled 2014-11-29: qty 1

## 2014-11-29 MED ORDER — DIPHENHYDRAMINE HCL 50 MG/ML IJ SOLN
12.5000 mg | Freq: Four times a day (QID) | INTRAMUSCULAR | Status: DC | PRN
Start: 2014-11-29 — End: 2014-12-01

## 2014-11-29 MED ORDER — ONDANSETRON HCL 4 MG/2ML IJ SOLN: 4.0000 mg | Freq: Once | INTRAMUSCULAR | Status: DC | PRN

## 2014-11-29 MED ORDER — INFLUENZA VAC SPLIT QUAD 0.5 ML IM SUSY
0.5000 mL | PREFILLED_SYRINGE | INTRAMUSCULAR | Status: AC
  Administered 2014-11-30: 0.5 mL via INTRAMUSCULAR
  Filled 2014-11-29: qty 0.5

## 2014-11-29 MED ORDER — SUCCINYLCHOLINE CHLORIDE 20 MG/ML IJ SOLN
INTRAMUSCULAR | Status: AC
  Filled 2014-11-29: qty 1

## 2014-11-29 MED ORDER — ACETAMINOPHEN 650 MG RE SUPP: 650.0000 mg | Freq: Four times a day (QID) | RECTAL | Status: DC | PRN

## 2014-11-29 MED ORDER — ROCURONIUM BROMIDE 100 MG/10ML IV SOLN
INTRAVENOUS | Status: DC | PRN
  Administered 2014-11-29: 10 mg via INTRAVENOUS

## 2014-11-29 MED ORDER — PIPERACILLIN-TAZOBACTAM 3.375 G IVPB
3.3750 g | Freq: Three times a day (TID) | INTRAVENOUS | Status: DC
  Administered 2014-11-29 – 2014-12-01 (×7): 3.375 g via INTRAVENOUS
  Filled 2014-11-29 (×9): qty 50

## 2014-11-29 MED ORDER — FENTANYL CITRATE 0.05 MG/ML IJ SOLN
INTRAMUSCULAR | Status: DC | PRN
  Administered 2014-11-29: 100 ug via INTRAVENOUS
  Administered 2014-11-29: 50 ug via INTRAVENOUS

## 2014-11-29 MED ORDER — ONDANSETRON HCL 4 MG/2ML IJ SOLN
4.0000 mg | Freq: Four times a day (QID) | INTRAMUSCULAR | Status: DC | PRN
Start: 2014-11-29 — End: 2014-12-01

## 2014-11-29 MED ORDER — ONDANSETRON HCL 4 MG/2ML IJ SOLN
INTRAMUSCULAR | Status: DC | PRN
  Administered 2014-11-29: 4 mg via INTRAVENOUS

## 2014-11-29 MED ORDER — HYDROMORPHONE HCL 1 MG/ML IJ SOLN
0.2500 mg | INTRAMUSCULAR | Status: DC | PRN
  Administered 2014-11-29 (×2): 0.5 mg via INTRAVENOUS

## 2014-11-29 MED ORDER — LACTATED RINGERS IV SOLN
INTRAVENOUS | Status: DC | PRN
  Administered 2014-11-29: 12:00:00 via INTRAVENOUS

## 2014-11-29 MED ORDER — INSULIN ASPART 100 UNIT/ML ~~LOC~~ SOLN
0.0000 [IU] | Freq: Three times a day (TID) | SUBCUTANEOUS | Status: DC
  Administered 2014-11-29: 11 [IU] via SUBCUTANEOUS
  Administered 2014-11-30: 4 [IU] via SUBCUTANEOUS
  Administered 2014-11-30: 11 [IU] via SUBCUTANEOUS
  Administered 2014-11-30: 7 [IU] via SUBCUTANEOUS
  Administered 2014-12-01: 3 [IU] via SUBCUTANEOUS

## 2014-11-29 MED ORDER — NEOSTIGMINE METHYLSULFATE 10 MG/10ML IV SOLN
INTRAVENOUS | Status: DC | PRN
  Administered 2014-11-29: 2 mg via INTRAVENOUS

## 2014-11-29 MED ORDER — GLYCOPYRROLATE 0.2 MG/ML IJ SOLN
INTRAMUSCULAR | Status: DC | PRN
  Administered 2014-11-29: 0.2 mg via INTRAVENOUS
  Administered 2014-11-29: 0.3 mg via INTRAVENOUS

## 2014-11-29 MED ORDER — PROPOFOL 10 MG/ML IV BOLUS
INTRAVENOUS | Status: AC
  Filled 2014-11-29: qty 20

## 2014-11-29 MED ORDER — HYDROCODONE-ACETAMINOPHEN 5-325 MG PO TABS
1.0000 | ORAL_TABLET | ORAL | Status: DC | PRN
  Administered 2014-11-30 – 2014-12-01 (×2): 2 via ORAL
  Filled 2014-11-29 (×2): qty 2

## 2014-11-29 MED ORDER — NEOSTIGMINE METHYLSULFATE 10 MG/10ML IV SOLN
INTRAVENOUS | Status: AC
  Filled 2014-11-29: qty 1

## 2014-11-29 MED ORDER — MIDAZOLAM HCL 5 MG/5ML IJ SOLN
INTRAMUSCULAR | Status: DC | PRN
  Administered 2014-11-29: 1 mg via INTRAVENOUS

## 2014-11-29 MED ORDER — DEXAMETHASONE SODIUM PHOSPHATE 4 MG/ML IJ SOLN
INTRAMUSCULAR | Status: DC | PRN
  Administered 2014-11-29: 8 mg via INTRAVENOUS

## 2014-11-29 MED ORDER — HYDROMORPHONE HCL 1 MG/ML IJ SOLN
0.5000 mg | INTRAMUSCULAR | Status: DC | PRN
  Administered 2014-11-30 (×2): 0.5 mg via INTRAVENOUS
  Filled 2014-11-29 (×3): qty 1

## 2014-11-29 MED ORDER — ACETAMINOPHEN 325 MG PO TABS
650.0000 mg | ORAL_TABLET | Freq: Four times a day (QID) | ORAL | Status: DC | PRN
  Filled 2014-11-29: qty 2

## 2014-11-29 MED ORDER — LIDOCAINE HCL (CARDIAC) 20 MG/ML IV SOLN
INTRAVENOUS | Status: DC | PRN
  Administered 2014-11-29: 50 mg via INTRAVENOUS

## 2014-11-29 MED ORDER — ONDANSETRON HCL 4 MG/2ML IJ SOLN
INTRAMUSCULAR | Status: AC
Start: 2014-11-29 — End: 2014-11-29
  Filled 2014-11-29: qty 2

## 2014-11-29 MED ORDER — GLYCOPYRROLATE 0.2 MG/ML IJ SOLN
INTRAMUSCULAR | Status: AC
  Filled 2014-11-29: qty 2

## 2014-11-29 MED ORDER — MIDAZOLAM HCL 2 MG/2ML IJ SOLN
INTRAMUSCULAR | Status: AC
  Filled 2014-11-29: qty 2

## 2014-11-29 MED ORDER — SENNOSIDES-DOCUSATE SODIUM 8.6-50 MG PO TABS
1.0000 | ORAL_TABLET | Freq: Every evening | ORAL | Status: DC | PRN
  Filled 2014-11-29: qty 1

## 2014-11-29 MED ORDER — SUCCINYLCHOLINE CHLORIDE 20 MG/ML IJ SOLN
INTRAMUSCULAR | Status: DC | PRN
  Administered 2014-11-29: 200 mg via INTRAVENOUS

## 2014-11-29 MED ORDER — PROPOFOL 10 MG/ML IV BOLUS
INTRAVENOUS | Status: DC | PRN
  Administered 2014-11-29: 100 mg via INTRAVENOUS
  Administered 2014-11-29: 50 mg via INTRAVENOUS
  Administered 2014-11-29: 200 mg via INTRAVENOUS

## 2014-11-29 MED ORDER — ENOXAPARIN SODIUM 40 MG/0.4ML ~~LOC~~ SOLN
40.0000 mg | SUBCUTANEOUS | Status: DC
  Administered 2014-11-30 – 2014-12-01 (×2): 40 mg via SUBCUTANEOUS
  Filled 2014-11-29 (×2): qty 0.4

## 2014-11-29 SURGICAL SUPPLY — 31 items
CANISTER SUCTION 2500CC (MISCELLANEOUS) ×3 IMPLANT
COVER SURGICAL LIGHT HANDLE (MISCELLANEOUS) ×3 IMPLANT
DRAPE UTILITY XL STRL (DRAPES) ×12 IMPLANT
DRSG PAD ABDOMINAL 8X10 ST (GAUZE/BANDAGES/DRESSINGS) ×3 IMPLANT
ELECT CAUTERY BLADE 6.4 (BLADE) IMPLANT
ELECT REM PT RETURN 9FT ADLT (ELECTROSURGICAL) ×3
ELECTRODE REM PT RTRN 9FT ADLT (ELECTROSURGICAL) ×1 IMPLANT
GAUZE IODOFORM PACK 1/2 7832 (GAUZE/BANDAGES/DRESSINGS) ×3 IMPLANT
GAUZE SPONGE 4X4 12PLY STRL (GAUZE/BANDAGES/DRESSINGS) ×3 IMPLANT
GLOVE BIO SURGEON STRL SZ7.5 (GLOVE) ×3 IMPLANT
GLOVE BIOGEL PI IND STRL 8 (GLOVE) ×1 IMPLANT
GLOVE BIOGEL PI INDICATOR 8 (GLOVE) ×2
GOWN STRL REUS W/ TWL LRG LVL3 (GOWN DISPOSABLE) ×1 IMPLANT
GOWN STRL REUS W/ TWL XL LVL3 (GOWN DISPOSABLE) ×1 IMPLANT
GOWN STRL REUS W/TWL LRG LVL3 (GOWN DISPOSABLE) ×3
GOWN STRL REUS W/TWL XL LVL3 (GOWN DISPOSABLE) ×3
KIT BASIN OR (CUSTOM PROCEDURE TRAY) ×3 IMPLANT
KIT ROOM TURNOVER OR (KITS) ×3 IMPLANT
NS IRRIG 1000ML POUR BTL (IV SOLUTION) ×3 IMPLANT
PACK LITHOTOMY IV (CUSTOM PROCEDURE TRAY) ×3 IMPLANT
PAD ARMBOARD 7.5X6 YLW CONV (MISCELLANEOUS) ×3 IMPLANT
PENCIL BUTTON HOLSTER BLD 10FT (ELECTRODE) ×3 IMPLANT
SPONGE LAP 18X18 X RAY DECT (DISPOSABLE) ×3 IMPLANT
SWAB COLLECTION DEVICE MRSA (MISCELLANEOUS) IMPLANT
TOWEL OR 17X24 6PK STRL BLUE (TOWEL DISPOSABLE) ×3 IMPLANT
TOWEL OR 17X26 10 PK STRL BLUE (TOWEL DISPOSABLE) ×3 IMPLANT
TUBE ANAEROBIC SPECIMEN COL (MISCELLANEOUS) IMPLANT
TUBE CONNECTING 12'X1/4 (SUCTIONS) ×1
TUBE CONNECTING 12X1/4 (SUCTIONS) ×2 IMPLANT
UNDERPAD 30X30 INCONTINENT (UNDERPADS AND DIAPERS) ×3 IMPLANT
YANKAUER SUCT BULB TIP NO VENT (SUCTIONS) ×3 IMPLANT

## 2014-11-29 NOTE — Transfer of Care (Signed)
Immediate Anesthesia Transfer of Care Note  Patient: Vincent Mcintyre  Procedure(s) Performed: Procedure(s): IRRIGATION AND DEBRIDEMENT PERIRECTAL ABSCESS (N/A)  Patient Location: PACU  Anesthesia Type:General  Level of Consciousness: patient cooperative and responds to stimulation  Airway & Oxygen Therapy: Patient Spontanous Breathing and Patient connected to nasal cannula oxygen  Post-op Assessment: Report given to RN and Post -op Vital signs reviewed and stable  Post vital signs: Reviewed and stable  Last Vitals:  Filed Vitals:   11/29/14 1045  BP: 134/83  Pulse: 80  Temp:   Resp:     Complications: No apparent anesthesia complications

## 2014-11-29 NOTE — Anesthesia Preprocedure Evaluation (Signed)
Anesthesia Evaluation  Patient identified by MRN, date of birth, ID band  Reviewed: Allergy & Precautions, NPO status , Patient's Chart, lab work & pertinent test results  Airway        Dental   Pulmonary          Cardiovascular hypertension,     Neuro/Psych    GI/Hepatic   Endo/Other  diabetes, Type 2, Oral Hypoglycemic Agents  Renal/GU      Musculoskeletal   Abdominal   Peds  Hematology   Anesthesia Other Findings   Reproductive/Obstetrics                             Anesthesia Physical Anesthesia Plan  ASA: III  Anesthesia Plan: General   Post-op Pain Management:    Induction: Intravenous  Airway Management Planned: LMA and Oral ETT  Additional Equipment:   Intra-op Plan:   Post-operative Plan: Extubation in OR  Informed Consent: I have reviewed the patients History and Physical, chart, labs and discussed the procedure including the risks, benefits and alternatives for the proposed anesthesia with the patient or authorized representative who has indicated his/her understanding and acceptance.     Plan Discussed with: CRNA, Anesthesiologist and Surgeon  Anesthesia Plan Comments:         Anesthesia Quick Evaluation

## 2014-11-29 NOTE — ED Provider Notes (Signed)
CSN: [id number]     Arrival date & time 11/29/14  0903 History   First MD Initiated Contact with Patient 11/29/14 [phone]     Chief Complaint  Patient presents with  . Anal Fissure     (Consider location/radiation/quality/duration/timing/severity/associated sxs/prior Treatment) HPI  49 year old male presents with continued rectal pain that has been worsening since he was seen in the ER 5 days ago. Overall he has had this pain for about 8 days. The patient and wife state that he has had swelling to his left buttocks and redness. Little bit of drainage from where they diagnosis fissures before. No fevers or chills. Has been trying sitz baths and witch hazel.  Past Medical History  Diagnosis Date  . Hypertension   . Diabetes mellitus   . Hyperlipidemia    History reviewed. No pertinent past surgical history. No family history on file. History  Substance Use Topics  . Smoking status: Never Smoker   . Smokeless tobacco: Not on file  . Alcohol Use: Yes    Review of Systems  Constitutional: Negative for fever and chills.  Gastrointestinal: Positive for rectal pain. Negative for vomiting, blood in stool and anal bleeding.  All other systems reviewed and are negative.     Allergies  Review of patient's allergies indicates no known allergies.  Home Medications   Prior to Admission medications   Medication Sig Start Date End Date Taking? Authorizing Provider  glyBURIDE (DIABETA) 5 MG tablet Take 1 tablet (5 mg total) by mouth daily with breakfast. 12/22/12  Yes Nicole Pisciotta, PA-C  HYDROcodone-acetaminophen (NORCO/VICODIN) 5-325 MG per tablet Take 1 tablet by mouth every 6 (six) hours as needed for moderate pain.   Yes Historical Provider, MD  lisinopril-hydrochlorothiazide (PRINZIDE,ZESTORETIC) 20-25 MG per tablet Take 1 tablet by mouth daily. 11/10/14  Yes Historical Provider, MD  metFORMIN (GLUCOPHAGE) 500 MG tablet Take 1,000 mg by mouth 2 (two) times daily with a meal.   Yes  Historical Provider, MD  naproxen (NAPROSYN) 375 MG tablet Take 375 mg by mouth every evening.   Yes Historical Provider, MD  naproxen sodium (ANAPROX) 220 MG tablet Take 220 mg by mouth daily as needed (for pain).   Yes Historical Provider, MD  simvastatin (ZOCOR) 40 MG tablet Take 40 mg by mouth every morning. 10/17/14  Yes Historical Provider, MD   BP 145/95 mmHg  Pulse 89  Temp(Src) 98.6 F (37 C) (Oral)  Resp 15  Ht 5\' 10"  (1.778 m)  Wt 280 lb (127.007 kg)  BMI 40.18 kg/m2  SpO2 94% Physical Exam  Constitutional: He is oriented to person, place, and time. He appears well-developed and well-nourished.  HENT:  Head: Normocephalic and atraumatic.  Right Ear: External ear normal.  Left Ear: External ear normal.  Nose: Nose normal.  Eyes: Right eye exhibits no discharge. Left eye exhibits no discharge.  Neck: Neck supple.  Cardiovascular: Normal rate.   Pulmonary/Chest: Effort normal.  Abdominal: He exhibits no distension.  Genitourinary: Rectal exam shows tenderness. Rectal exam shows no external hemorrhoid, no mass and anal tone normal.     Tenderness on rectal exam but no fluctuant abscess noted   Musculoskeletal: He exhibits no edema.  Neurological: He is alert and oriented to person, place, and time.  Skin: Skin is warm and dry.  Nursing note and vitals reviewed.   ED Course  Procedures (including critical care time) Labs Review Labs Reviewed - No data to display  Imaging Review No results found.   EKG Interpretation  None      MDM   Final diagnoses:  Perirectal abscess    Patient has evidence of cellulitis and possible abscess. Surgery consulted, and due to drainage noted they are concerned about deeper infection, and will admit for OR exam and possible drainage.     Sherwood Gambler, MD 11/29/14 1057

## 2014-11-29 NOTE — Anesthesia Postprocedure Evaluation (Signed)
  Anesthesia Post-op Note  Patient: Vincent Mcintyre  Procedure(s) Performed: Procedure(s): IRRIGATION AND DEBRIDEMENT PERIRECTAL ABSCESS (N/A)  Patient Location: PACU  Anesthesia Type:General  Level of Consciousness: awake, alert , oriented and patient cooperative  Airway and Oxygen Therapy: Patient Spontanous Breathing  Post-op Pain: mild  Post-op Assessment: Post-op Vital signs reviewed, Patient's Cardiovascular Status Stable, Respiratory Function Stable, Patent Airway, No signs of Nausea or vomiting and Pain level controlled  Post-op Vital Signs: stable  Last Vitals:  Filed Vitals:   11/29/14 1300  BP: 131/82  Pulse:   Temp:   Resp:     Complications: No apparent anesthesia complications

## 2014-11-29 NOTE — ED Notes (Signed)
Report given to OR.

## 2014-11-29 NOTE — ED Notes (Signed)
Pt complaining of a pain around his rectum since March 15th. Pt came to the ED on the 24th and was diagnosed anal fissure. Pt reports an increase in pain since his previous ED visit. Pt denies N/V/D. Pt denies fever.

## 2014-11-29 NOTE — Op Note (Signed)
11/29/2014  12:22 PM  PATIENT:  Vincent Mcintyre  49 y.o. male  PRE-OPERATIVE DIAGNOSIS:  Perirectal Abscess  POST-OPERATIVE DIAGNOSIS:  same  PROCEDURE:  Procedure(s): IRRIGATION AND DEBRIDEMENT PERIRECTAL ABSCESS (N/A)  SURGEON:  Surgeon(s) and Role:    * Ralene Ok, MD - Primary  PHYSICIAN ASSISTANT:   ASSISTANTS: none   ANESTHESIA:   general  EBL:   5cc  BLOOD ADMINISTERED:none  DRAINS: 1/2" iodoform guaze   LOCAL MEDICATIONS USED:  NONE  SPECIMEN:  Source of Specimen:  Perirectal abscess  DISPOSITION OF SPECIMEN:  micro  COUNTS:  YES  TOURNIQUET:  * No tourniquets in log *  DICTATION: .Dragon Dictation After the patient was consented, he was taken back to the OR and placed in lithotomy.  He was prepped and draped in the usual sterile fashion.   A time out was called and all facts were verified.    A digital rectal exam begun the case.  There were no masses palpated.  A large fluctance was seen on his left gluteus.  This area was incised.  A large amount of pus was expressed.  Cultures were obtained.  A hemostats and a finger were used to break up an loculation.  The pocket was irrigated out and there was no communication to the rectum.    A bottle of 1/2" iodoform guaze was packed in place.  The wound was dressed with 4x4's, ABD pads, and mesh panties.  The patient tolerated the procedure well, and was taken to the PACU in stable condition.  PLAN OF CARE: Admit to inpatient    PATIENT DISPOSITION:  PACU - hemodynamically stable.   Delay start of Pharmacological VTE agent (>24hrs) due to surgical blood loss or risk of bleeding: not applicable

## 2014-11-29 NOTE — H&P (Signed)
  Vincent Mcintyre 1966/07/20  [id number].   Primary Care MD: Dr. Leighton Ruff Chief Complaint/Reason for Consult: rectal pain HPI: This is a pleasant 49 yo white male who began having rectal pain last Tuesday.  He went to see his PCP who felt he had a perirectal abscess and sent him to [facility].  Upon arrival here he had a CT scan that was negative for an abscess, although looking back he may have had the small beginnings of one.  He was diagnosed with an anal fissure and sent home.  He returned today due to continued pain.  He now has a significant amount of cellulitis on his left gluteus and some drainage.  He has pain with sitting, but no pain with bowel movements.  He has had no issues with constipation.  We have been asked to see him.  ROS : Please see HPI, otherwise negative.  Denies fevers or chills.  Low grade 99 last Thursday only  No family history on file.  Past Medical History  Diagnosis Date  . Hypertension   . Diabetes mellitus      Dr. Leighton Ruff - [facility]  . Hyperlipidemia     History reviewed. No pertinent past surgical history.  Social History:  reports that he has never smoked. He does not have any smokeless tobacco history on file. He reports that he does not drink alcohol or use illicit drugs.  Allergies: No Known Allergies   (Not in a hospital admission)  Blood pressure 145/95, pulse 89, temperature 98.6 F (37 C), temperature source Oral, resp. rate 15, height 5\' 10"  (1.778 m), weight 127.007 kg (280 lb), SpO2 94 %. Physical Exam: General: pleasant, obese white male who is laying in bed in NAD HEENT: head is normocephalic, atraumatic.  Sclera are noninjected.  PERRL.  Ears and nose without any masses or lesions.  Mouth is pink and moist Heart: regular, rate, and rhythm.  Normal s1,s2. No obvious murmurs, gallops, or rubs noted.  Palpable radial and pedal pulses bilaterally Lungs: CTAB, no wheezes, rhonchi, or rales noted.  Respiratory effort  nonlabored Abd: soft, NT, ND, +BS, no masses, hernias, or organomegaly Rectal: no obvious external fissure seen.  He does have some purulent drainage noted in the crevice but no spontaneous opening is noted.  ? Drainage from his rectum.  Large amount of induration, erythema, and edema on the left gluteus.  He even has some excoriation of the skin in this area.  MS: all 4 extremities are symmetrical with no cyanosis, clubbing, or edema. Skin: warm and dry with no masses, lesions, or rashes Psych: A&Ox3 with an appropriate affect.    No results found for this or any previous visit (from the past 48 hour(s)). No results found.     Assessment/Plan 1. Perirectal abscess, ? Some internal drainage -to OR for EUA and I&D of abscess -IV zosyn -check labs 2. DM -SSI 3. HTN -restart home antihypertensives  Nahal Wanless E 11/29/2014, 10:37 AM Pager: [phone]

## 2014-11-30 ENCOUNTER — Encounter (HOSPITAL_COMMUNITY): Payer: Self-pay | Admitting: General Surgery

## 2014-11-30 DIAGNOSIS — E785 Hyperlipidemia, unspecified: Secondary | ICD-10-CM | POA: Diagnosis present

## 2014-11-30 DIAGNOSIS — I10 Essential (primary) hypertension: Secondary | ICD-10-CM | POA: Diagnosis present

## 2014-11-30 DIAGNOSIS — E1165 Type 2 diabetes mellitus with hyperglycemia: Secondary | ICD-10-CM

## 2014-11-30 DIAGNOSIS — L0390 Cellulitis, unspecified: Secondary | ICD-10-CM | POA: Diagnosis present

## 2014-11-30 DIAGNOSIS — Z6841 Body Mass Index (BMI) 40.0 and over, adult: Secondary | ICD-10-CM | POA: Diagnosis not present

## 2014-11-30 DIAGNOSIS — K611 Rectal abscess: Secondary | ICD-10-CM | POA: Diagnosis present

## 2014-11-30 LAB — CBC
HEMATOCRIT: 41.3 % (ref 39.0–52.0)
Hemoglobin: 14 g/dL (ref 13.0–17.0)
MCH: 28.8 pg (ref 26.0–34.0)
MCHC: 33.9 g/dL (ref 30.0–36.0)
MCV: 85 fL (ref 78.0–100.0)
PLATELETS: 233 10*3/uL (ref 150–400)
RBC: 4.86 MIL/uL (ref 4.22–5.81)
RDW: 13 % (ref 11.5–15.5)
WBC: 12.2 10*3/uL — ABNORMAL HIGH (ref 4.0–10.5)

## 2014-11-30 LAB — URINALYSIS, ROUTINE W REFLEX MICROSCOPIC
BILIRUBIN URINE: NEGATIVE
Glucose, UA: 500 mg/dL — AB
HGB URINE DIPSTICK: NEGATIVE
Ketones, ur: 15 mg/dL — AB
Leukocytes, UA: NEGATIVE
NITRITE: NEGATIVE
PROTEIN: NEGATIVE mg/dL
UROBILINOGEN UA: 0.2 mg/dL (ref 0.0–1.0)
pH: 6 (ref 5.0–8.0)

## 2014-11-30 LAB — GLUCOSE, CAPILLARY
Glucose-Capillary: 239 mg/dL — ABNORMAL HIGH (ref 70–99)
Glucose-Capillary: 296 mg/dL — ABNORMAL HIGH (ref 70–99)
Glucose-Capillary: 181 mg/dL — ABNORMAL HIGH (ref 70–99)
Glucose-Capillary: 158 mg/dL — ABNORMAL HIGH (ref 70–99)

## 2014-11-30 LAB — HEMOGLOBIN A1C
Hgb A1c MFr Bld: 11.3 % — ABNORMAL HIGH (ref 4.8–5.6)
MEAN PLASMA GLUCOSE: 278 mg/dL

## 2014-11-30 MED ORDER — LISINOPRIL 20 MG PO TABS
20.0000 mg | ORAL_TABLET | Freq: Every day | ORAL | Status: DC
  Administered 2014-11-30 – 2014-12-01 (×2): 20 mg via ORAL
  Filled 2014-11-30 (×2): qty 1

## 2014-11-30 MED ORDER — MAGNESIUM SULFATE GRAN
GRANULES | Freq: Every day | Status: DC
  Filled 2014-11-30: qty 454

## 2014-11-30 MED ORDER — INSULIN ASPART 100 UNIT/ML ~~LOC~~ SOLN
8.0000 [IU] | Freq: Three times a day (TID) | SUBCUTANEOUS | Status: DC
  Administered 2014-11-30 – 2014-12-01 (×3): 8 [IU] via SUBCUTANEOUS

## 2014-11-30 MED ORDER — GLYBURIDE 5 MG PO TABS
5.0000 mg | ORAL_TABLET | Freq: Every day | ORAL | Status: DC
  Administered 2014-11-30: 5 mg via ORAL
  Filled 2014-11-30 (×4): qty 1

## 2014-11-30 MED ORDER — SIMVASTATIN 40 MG PO TABS
40.0000 mg | ORAL_TABLET | Freq: Every morning | ORAL | Status: DC
  Administered 2014-11-30 – 2014-12-01 (×2): 40 mg via ORAL
  Filled 2014-11-30 (×2): qty 1

## 2014-11-30 MED ORDER — HYDROCHLOROTHIAZIDE 25 MG PO TABS: 25.0000 mg | ORAL_TABLET | Freq: Every day | ORAL | Status: DC

## 2014-11-30 MED ORDER — INSULIN STARTER KIT- PEN NEEDLES (ENGLISH)
1.0000 | Freq: Once | Status: AC
  Administered 2014-11-30: 1
  Filled 2014-11-30: qty 1

## 2014-11-30 MED ORDER — LIVING WELL WITH DIABETES BOOK
Freq: Once | Status: AC
  Administered 2014-11-30: 15:00:00
  Filled 2014-11-30: qty 1

## 2014-11-30 MED ORDER — INSULIN GLARGINE 100 UNIT/ML ~~LOC~~ SOLN
25.0000 [IU] | Freq: Every day | SUBCUTANEOUS | Status: DC
  Administered 2014-11-30 – 2014-12-01 (×2): 25 [IU] via SUBCUTANEOUS
  Filled 2014-11-30 (×2): qty 0.25

## 2014-11-30 MED ORDER — METFORMIN HCL 500 MG PO TABS
1000.0000 mg | ORAL_TABLET | Freq: Two times a day (BID) | ORAL | Status: DC
Start: 2014-11-30 — End: 2014-12-01
  Administered 2014-11-30 – 2014-12-01 (×3): 1000 mg via ORAL
  Filled 2014-11-30 (×3): qty 2

## 2014-11-30 NOTE — Progress Notes (Signed)
[facility] Progress Note  1 Day Post-Op  Subjective: Pt without N/V, tolerating diet.  Dressing change was quite painful this am since so deep.  Ambulating well.  Urinating well, no BM today.    Objective: Vital signs in last 24 hours: Temp:  [97.9 F (36.6 C)-99.7 F (37.6 C)] 97.9 F (36.6 C) (03/22 0542) Pulse Rate:  [66-90] 73 (03/22 0542) Resp:  [11-23] 17 (03/22 0542) BP: (111-145)/(63-95) 123/76 mmHg (03/22 0542) SpO2:  [92 %-100 %] 98 % (03/22 0542) Weight:  [127.007 kg (280 lb)] 127.007 kg (280 lb) (03/21 1548) Last BM Date: 11/29/14  Intake/Output from previous day: 03/21 0701 - 03/22 0700 In: 1976.7 [P.O.:840; I.V.:1136.7] Out: 2075 [Urine:2075] Intake/Output this shift:    PE: Gen:  Alert, NAD, pleasant Buttock:  1cm incision with 3cm deep abscess cavity, repacked today, swelling still present, erythema improved, tender to palpation.  Lab Results:   Recent Labs  11/29/14 1119 11/30/14 0432  WBC 9.7 12.2*  HGB 14.1 14.0  HCT 40.2 41.3  PLT 197 233   BMET  Recent Labs  11/29/14 1119  NA 137  K 3.8  CL 101  CO2 27  GLUCOSE 239*  BUN 14  CREATININE 0.76  CALCIUM 8.8   PT/INR No results for input(s): LABPROT, INR in the last 72 hours. CMP     Component Value Date/Time   NA 137 11/29/2014 1119   K 3.8 11/29/2014 1119   CL 101 11/29/2014 1119   CO2 27 11/29/2014 1119   GLUCOSE 239* 11/29/2014 1119   BUN 14 11/29/2014 1119   CREATININE 0.76 11/29/2014 1119   CALCIUM 8.8 11/29/2014 1119   PROT 6.3 11/29/2014 1119   ALBUMIN 3.3* 11/29/2014 1119   AST 23 11/29/2014 1119   ALT 33 11/29/2014 1119   ALKPHOS 48 11/29/2014 1119   BILITOT 1.0 11/29/2014 1119   GFRNONAA >90 11/29/2014 1119   GFRAA >90 11/29/2014 1119   Lipase  No results found for: LIPASE     Studies/Results: No results found.  Anti-infectives: Anti-infectives    Start     Dose/Rate Route Frequency Ordered Stop   11/29/14 1045  piperacillin-tazobactam  (ZOSYN) IVPB 3.375 g     3.375 g 12.5 mL/hr over 240 Minutes Intravenous 3 times per day 11/29/14 1033         Assessment/Plan Perirectal abscess POD #1 s/p I&D -IV zosyn Day #2/7 -Dressing changes BID -Ambulate and IS -SCD's and lovenox -Carb mod diet Leukocytosis 12.2 -Recheck tomorrow DM - uncontrolled on glyburide and metformin -SSI -Home anti-glycemic agents -Ask [facility] hospitalist to see and eval the patient given his Hgb A1C is 11.3 -Educated that his blood sugars are likely the cause of his infection, and that better control of his sugars will hopefully reduce the risk of abscesses reforming. HTN -Resume home antihypertensives Disp -d/c home tomorrow?      Coralie Keens 11/30/2014, 7:43 AM Pager: [phone]

## 2014-11-30 NOTE — Consult Note (Signed)
[facility]       PATIENT DETAILS Name: Vincent Mcintyre Age: 49 y.o. Sex: male Date of Birth: 22-Apr-1966 Admit Date: 11/29/2014 PCP:No primary care provider on file. Requesting MD: Nolon Nations, MD  Date of consultation: 11/30/14  REASON FOR CONSULTATION:  Management of uncontrolled diabetes  Impression 1.Uncontrolled diabetes with A1c of 11.3 while on metformin and glipizide 2.Hypertension-currently controlled without the use of any antihypertensive medications 3.Dyslipidemia 4.Perirectal abscess-status post incision and debridement by general surgery  Recommendations 1. Uncontrolled diabetes: Since A1c more than 11 while on 2 oral medications, start Lantus 25 units, along with 8 units of NovoLog pre-meal. Continue sliding scale. We will follow and adjust insulin regimen accordingly. Begin insulin teaching, diabetic education. 2. Hypertension: Currently controlled, suspect will need resumption of lisinopril at some point. 3. Dyslipidemia: Continue statins on discharge 4. Perirectal abscess-status post incision and debridement: We will defer to primary service 5. Morbid obesity with BMI of 40.3: Will require ongoing counseling regarding importance of weight loss   [facility] will followup again tomorrow. Please contact me if I can be of assistance in the meanwhile. Thank you for this consultation.  [facility] Pager [phone]  HPI: Patient is a 49 year old male with history of type 2 diabetes, dyslipidemia, hypertension, obesity was admitted by the general surgery service on 3/21 with rectal pain. Further evaluation revealed a perirectal abscess. Patient underwent incision and drainage on 3/21. He was noted to have uncontrolled diabetes, and A1c of 11.3, the hospitalist service was asked to consult for management of uncontrolled hyperglycemia. Per patient, he has had diabetes for the past 3 years, and has been on metformin and  glipizide. Patient claims that his sugars normally run in the 150s range, and he is unaware of his last A1c.  ALLERGIES:  No Known Allergies  PAST MEDICAL HISTORY: Past Medical History  Diagnosis Date  . Hypertension   . Diabetes mellitus      Dr. Leighton Ruff - [place] [place] PCP  . Hyperlipidemia     PAST SURGICAL HISTORY: Past Surgical History  Procedure Laterality Date  . Incision and drainage perirectal abscess  11/29/2014    MEDICATIONS AT HOME: Prior to Admission medications   Medication Sig Start Date End Date Taking? Authorizing Provider  glyBURIDE (DIABETA) 5 MG tablet Take 1 tablet (5 mg total) by mouth daily with breakfast. 12/22/12  Yes Nicole Pisciotta, PA-C  HYDROcodone-acetaminophen (NORCO/VICODIN) 5-325 MG per tablet Take 1 tablet by mouth every 6 (six) hours as needed for moderate pain.   Yes Historical Provider, MD  lisinopril-hydrochlorothiazide (PRINZIDE,ZESTORETIC) 20-25 MG per tablet Take 1 tablet by mouth daily. 11/10/14  Yes Historical Provider, MD  metFORMIN (GLUCOPHAGE) 500 MG tablet Take 1,000 mg by mouth 2 (two) times daily with a meal.   Yes Historical Provider, MD  naproxen (NAPROSYN) 375 MG tablet Take 375 mg by mouth every evening.   Yes Historical Provider, MD  naproxen sodium (ANAPROX) 220 MG tablet Take 220 mg by mouth daily as needed (for pain).   Yes Historical Provider, MD  simvastatin (ZOCOR) 40 MG tablet Take 40 mg by mouth every morning. 10/17/14  Yes Historical Provider, MD    FAMILY HISTORY: History reviewed. No pertinent family history.  SOCIAL HISTORY:  reports that he has never smoked. He has never used smokeless tobacco. He reports that he does not drink alcohol or use illicit drugs.  REVIEW OF SYSTEMS:  Constitutional:   No  weight loss, night sweats,  Fevers, chills, fatigue.  HEENT:    No headaches, Difficulty swallowing,Tooth/dental problems,Sore throat   Cardio-vascular: No chest pain,  Orthopnea, PND, swelling in lower  extremities  GI:  No heartburn, indigestion, abdominal pain, nausea, vomiting  Resp: No shortness of breath with exertion or at rest.  No excess mucus, no productive cough, No non-productive cough  Skin:  no rash or lesions.  GU:  no dysuria, change in color of urine, no urgency or frequency.  No flank pain.  Musculoskeletal: No joint pain or swelling.  No decreased range of motion.  No back pain.  Psych: No change in mood or affect. No depression or anxiety.  No memory loss.   PHYSICAL EXAM: Blood pressure 123/76, pulse 73, temperature 97.9 F (36.6 C), temperature source Oral, resp. rate 17, height 5\' 10"  (1.778 m), weight 127.007 kg (280 lb), SpO2 98 %.  General appearance :Awake, alert, not in any distress. Speech Clear. Not toxic Looking HEENT: Atraumatic and Normocephalic, pupils equally reactive to light and accomodation Neck: supple, no JVD. No cervical lymphadenopathy.  Chest:Good air entry bilaterally, no added sounds  CVS: S1 S2 regular, no murmurs.  Abdomen: Bowel sounds present, Non tender and not distended with no gaurding, rigidity or rebound. Extremities: B/L Lower Ext shows no edema, both legs are warm to touch, with  dorsalis pedis pulses palpable. Neurology: Non focal Skin:No Rash Wounds:N/A  LABS ON ADMISSION:   Recent Labs  11/29/14 1119  NA 137  K 3.8  CL 101  CO2 27  GLUCOSE 239*  BUN 14  CREATININE 0.76  CALCIUM 8.8    Recent Labs  11/29/14 1119  AST 23  ALT 33  ALKPHOS 48  BILITOT 1.0  PROT 6.3  ALBUMIN 3.3*   No results for input(s): LIPASE, AMYLASE in the last 72 hours.  Recent Labs  11/29/14 1119 11/30/14 0432  WBC 9.7 12.2*  HGB 14.1 14.0  HCT 40.2 41.3  MCV 84.6 85.0  PLT 197 233   No results for input(s): CKTOTAL, CKMB, CKMBINDEX, TROPONINI in the last 72 hours. No results for input(s): DDIMER in the last 72 hours. Invalid input(s): POCBNP   RADIOLOGIC STUDIES ON ADMISSION: Ct Abdomen Pelvis W  Contrast  11/26/2014   CLINICAL DATA:  Rectal pain.  Possible abscess.  EXAM: CT ABDOMEN AND PELVIS WITH CONTRAST  TECHNIQUE: Multidetector CT imaging of the abdomen and pelvis was performed using the standard protocol following bolus administration of intravenous contrast.  CONTRAST:  174mL OMNIPAQUE IOHEXOL 300 MG/ML  SOLN  COMPARISON:  None.  FINDINGS: There is mild fatty infiltration of the liver. There are no focal liver lesions. There is no bile duct dilatation. The gallbladder appears unremarkable. The spleen, pancreas, adrenals and kidneys appear normal. The abdominal aorta is normal in caliber. Bowel is unremarkable. There is no perirectal abscess. There is no inflammatory change in the abdomen or pelvis. The appendix is normal. There is no ascites. There is no adenopathy. There are no abnormalities in the lower chest. No acute musculoskeletal abnormalities are evident. There is severe degenerative disc change at L4-5 and L5-S1.  IMPRESSION: Mild fatty infiltration of the liver. No perirectal abscess. No acute inflammatory changes in the abdomen or pelvis. Severe lumbar degenerative disc changes at L4-5 and L5-S1   Electronically Signed   By: Andreas Newport M.D.   On: 11/26/2014 01:02    Total time spent 45 minutes.  [facility] Pager [phone]  If 7PM-7AM, please contact night-coverage [email or web address] Password [facility] 11/30/2014, 8:43 AM

## 2014-11-30 NOTE — Progress Notes (Signed)
UR completed 

## 2014-11-30 NOTE — Progress Notes (Signed)
Inpatient Diabetes Program Recommendations  AACE/ADA: New Consensus Statement on Inpatient Glycemic Control (2013)  Target Ranges:  Prepandial:   less than 140 mg/dL      Peak postprandial:   less than 180 mg/dL (1-2 hours)      Critically ill patients:  140 - 180 mg/dL   Reason for Assessment:  Results for Vincent Mcintyre, Vincent Mcintyre (MRN [id number]) as of 11/30/2014 14:24  Ref. Range 11/29/2014 12:54 11/29/2014 17:28 11/29/2014 21:14 11/30/2014 07:52 11/30/2014 11:44  Glucose-Capillary Latest Range: 70-99 mg/dL 198 (H) 266 (H) 315 (H) 239 (H) 296 (H)   Diabetes history: Type 2 diabetes for 3 years Outpatient Diabetes medications: Metformin 1000 mg bid, Glyburide 5 mg bid Current orders for Inpatient glycemic control:  Novolog resist. Tid with meals, Lantus 25 units daily, Novolog 8 units tid with meals  Talked at length with patient.  He states that he does not understand why he has to be on insulin? Discussed A1C results with patient.  Described that the results of and A1C and that it gave an average of 3 months of blood sugars.  He states that when he monitors at home, he usually gets a blood sugar reading of 150-180.  Discussed the actions profiles of both Lantus and Novolog.  Since patient is on both Metformin and Glyburide, may be able to be only on basal insulin at discharge.  Patient to watch diabetes videos today.  He also will begin practicing giving injections.  I will see patient again on 3/23 and teach him to use insulin pen as he does have Medicaid insurance which will cover the insulin pen.  He is agreeable to this.  Exericise is a challenge for this patient due to his back injury.  He states that he does swim in the summer with his son and that it does help his back pain.  Encouraged patient to swim for exercise.  Discussed goal CBG's and the importance of glycemic control for the healing of his abscess.    Will follow up on 3/23.  Patient does have a PCP, Dr. Leighton Ruff.  He plans to follow-up  with her.  He also asked about a diabetes specialist.  Told him to discuss with PCP.    Thanks, Adah Perl, RN, BC-ADM Inpatient Diabetes Coordinator Pager [phone]

## 2014-12-01 DIAGNOSIS — IMO0002 Reserved for concepts with insufficient information to code with codable children: Secondary | ICD-10-CM | POA: Diagnosis present

## 2014-12-01 DIAGNOSIS — E1165 Type 2 diabetes mellitus with hyperglycemia: Secondary | ICD-10-CM | POA: Diagnosis present

## 2014-12-01 LAB — CBC
HCT: 39.2 % (ref 39.0–52.0)
Hemoglobin: 13.2 g/dL (ref 13.0–17.0)
MCH: 29 pg (ref 26.0–34.0)
MCHC: 33.7 g/dL (ref 30.0–36.0)
MCV: 86.2 fL (ref 78.0–100.0)
PLATELETS: 221 10*3/uL (ref 150–400)
RBC: 4.55 MIL/uL (ref 4.22–5.81)
RDW: 13.2 % (ref 11.5–15.5)
WBC: 10.5 10*3/uL (ref 4.0–10.5)

## 2014-12-01 LAB — GLUCOSE, CAPILLARY: GLUCOSE-CAPILLARY: 124 mg/dL — AB (ref 70–99)

## 2014-12-01 LAB — CULTURE, ROUTINE-ABSCESS

## 2014-12-01 MED ORDER — HYDROCODONE-ACETAMINOPHEN 5-325 MG PO TABS: 1.0000 | ORAL_TABLET | Freq: Four times a day (QID) | ORAL | Status: DC | PRN

## 2014-12-01 MED ORDER — AMOXICILLIN-POT CLAVULANATE 875-125 MG PO TABS: 1.0000 | ORAL_TABLET | Freq: Two times a day (BID) | ORAL | Status: DC

## 2014-12-01 MED ORDER — MAGNESIUM SULFATE GRAN: GRANULES | Freq: Every day | Status: DC

## 2014-12-01 MED ORDER — INSULIN GLARGINE 100 UNIT/ML SOLOSTAR PEN: 25.0000 [IU] | PEN_INJECTOR | Freq: Every day | SUBCUTANEOUS | Status: DC

## 2014-12-01 MED ORDER — SENNOSIDES-DOCUSATE SODIUM 8.6-50 MG PO TABS: 1.0000 | ORAL_TABLET | Freq: Two times a day (BID) | ORAL | Status: DC

## 2014-12-01 MED ORDER — INSULIN PEN NEEDLE 29G X 5MM MISC: Status: AC

## 2014-12-01 MED ORDER — INSULIN ASPART 100 UNIT/ML FLEXPEN: 5.0000 [IU] | PEN_INJECTOR | Freq: Three times a day (TID) | SUBCUTANEOUS | Status: DC

## 2014-12-01 MED ORDER — POLYETHYLENE GLYCOL 3350 17 GM/SCOOP PO POWD: 8.5000 g | Freq: Two times a day (BID) | ORAL | Status: DC

## 2014-12-01 NOTE — Discharge Instructions (Signed)
Insulin Glargine injection What is this medicine? INSULIN GLARGINE (IN su lin GLAR geen) is a human-made form of insulin. This drug lowers the amount of sugar in your blood. It is a long-acting insulin that is usually given once a day. This medicine may be used for other purposes; ask your health care provider or pharmacist if you have questions. COMMON BRAND NAME(S): Lantus, Lantus SoloStar, Toujeo SoloStar What should I tell my health care provider before I take this medicine? They need to know if you have any of these conditions: -episodes of hypoglycemia -kidney disease -liver disease -an unusual or allergic reaction to insulin, metacresol, other medicines, foods, dyes, or preservatives -pregnant or trying to get pregnant -breast-feeding How should I use this medicine? This medicine is for injection under the skin. Use this medicine at the same time each day. Use exactly as directed. This insulin should never be mixed in the same syringe with other insulins before injection. Do not vigorously shake before use. You will be taught how to use this medicine and how to adjust doses for activities and illness. Do not use more insulin than prescribed. Always check the appearance of your insulin before using it. This medicine should be clear and colorless like water. Do not use it if it is cloudy, thickened, colored, or has solid particles in it. It is important that you put your used needles and syringes in a special sharps container. Do not put them in a trash can. If you do not have a sharps container, call your pharmacist or healthcare provider to get one. Talk to your pediatrician regarding the use of this medicine in children. Special care may be needed. Overdosage: If you think you have taken too much of this medicine contact a poison control center or emergency room at once. NOTE: This medicine is only for you. Do not share this medicine with others. What if I miss a dose? It is important  not to miss a dose. Your health care professional or doctor should discuss a plan for missed doses with you. If you do miss a dose, follow their plan. Do not take double doses. What may interact with this medicine? -other medicines for diabetes Many medications may cause an increase or decrease in blood sugar, these include: -alcohol containing beverages -aspirin and aspirin-like drugs -chloramphenicol -chromium -diuretics -male hormones, like estrogens or progestins and birth control pills -heart medicines -isoniazid -male hormones or anabolic steroids -medicines for weight loss -medicines for allergies, asthma, cold, or cough -medicines for mental problems -medicines called MAO Inhibitors like Nardil, Parnate, Marplan, Eldepryl -niacin -NSAIDs, medicines for pain and inflammation, like ibuprofen or naproxen -pentamidine -phenytoin -probenecid -quinolone antibiotics like ciprofloxacin, levofloxacin, ofloxacin -some herbal dietary supplements -steroid medicines like prednisone or cortisone -thyroid medicine Some medications can hide the warning symptoms of low blood sugar. You may need to monitor your blood sugar more closely if you are taking one of these medications. These include: -beta-blockers such as atenolol, metoprolol, propranolol -clonidine -guanethidine -reserpine This list may not describe all possible interactions. Give your health care provider a list of all the medicines, herbs, non-prescription drugs, or dietary supplements you use. Also tell them if you smoke, drink alcohol, or use illegal drugs. Some items may interact with your medicine. What should I watch for while using this medicine? Visit your health care professional or doctor for regular checks on your progress. A test called the HbA1C (A1C) will be monitored. This is a simple blood test. It measures your  blood sugar control over the last 2 to 3 months. You will receive this test every 3 to 6  months. Learn how to check your blood sugar. Learn the symptoms of low and high blood sugar and how to manage them. Always carry a quick-source of sugar with you in case you have symptoms of low blood sugar. Examples include hard sugar candy or glucose tablets. Make sure others know that you can choke if you eat or drink when you develop serious symptoms of low blood sugar, such as seizures or unconsciousness. They must get medical help at once. Tell your doctor or health care professional if you have high blood sugar. You might need to change the dose of your medicine. If you are sick or exercising more than usual, you might need to change the dose of your medicine. Do not skip meals. Ask your doctor or health care professional if you should avoid alcohol. Many nonprescription cough and cold products contain sugar or alcohol. These can affect blood sugar. Make sure that you have the right kind of syringe for the type of insulin you use. Try not to change the brand and type of insulin or syringe unless your health care professional or doctor tells you to. Switching insulin brand or type can cause dangerously high or low blood sugar. Always keep an extra supply of insulin, syringes, and needles on hand. Use a syringe one time only. Throw away syringe and needle in a closed container to prevent accidental needle sticks. Insulin pens and cartridges should never be shared. Even if the needle is changed, sharing may result in passing of viruses like hepatitis or HIV. Wear a medical ID bracelet or chain, and carry a card that describes your disease and details of your medicine and dosage times. What side effects may I notice from receiving this medicine? Side effects that you should report to your health care professional or doctor as soon as possible: -allergic reactions like skin rash, itching or hives, swelling of the face, lips, or tongue -breathing problems -signs and symptoms of high blood sugar such as  dizziness, dry mouth, dry skin, fruity breath, nausea, stomach pain, increased hunger or thirst, increased urination -signs and symptoms of low blood sugar such as feeling anxious, confusion, dizziness, increased hunger, unusually weak or tired, sweating, shakiness, cold, irritable, headache, blurred vision, fast heartbeat, loss of consciousness Side effects that usually do not require medical attention (report to your health care professional or doctor if they continue or are bothersome): -increase or decrease in fatty tissue under the skin due to overuse of a particular injection site -itching, burning, swelling, or rash at site where injected This list may not describe all possible side effects. Call your doctor for medical advice about side effects. You may report side effects to FDA at 1-800-FDA-1088. Where should I keep my medicine? Keep out of the reach of children. Store unopened vials in a refrigerator between 2 and 8 degrees C (36 and 46 degrees F). Do not freeze or use if the insulin has been frozen. Opened vials (vials currently in use) may be stored in the refrigerator or at room temperature, at approximately 25 degrees C (77 degrees F) or cooler. Keeping your insulin at room temperature decreases the amount of pain during injection. Once opened, your insulin can be used for 28 days. After 28 days, the vial should be thrown away. Store unopened pen-injector cartridges in a refrigerator between 2 and 8 degrees C (36 and 46 degrees F.)  Do not freeze or use if the insulin has been frozen. Insulin cartridges inserted into the [employer] system should be kept at room temperature, approximately 25 degrees C (77 degrees F) or cooler. Do not store in the refrigerator. Once inserted into the [employer] system, the insulin can be used for 28 days. After 28 days, the cartridge should be thrown away. Protect from light and excessive heat. Throw away any unused medicine after the expiration date or after the  specified time for room temperature storage has passed. NOTE: This sheet is a summary. It may not cover all possible information. If you have questions about this medicine, talk to your doctor, pharmacist, or health care provider.  2015, [employer] Standard. (2013-11-05 10:13:56)  Diabetes and Exercise Exercising regularly is important. It is not just about losing weight. It has many health benefits, such as:  Improving your overall fitness, flexibility, and endurance.  Increasing your bone density.  Helping with weight control.  Decreasing your body fat.  Increasing your muscle strength.  Reducing stress and tension.  Improving your overall health. People with diabetes who exercise gain additional benefits because exercise:  Reduces appetite.  Improves the body's use of blood sugar (glucose).  Helps lower or control blood glucose.  Decreases blood pressure.  Helps control blood lipids (such as cholesterol and triglycerides).  Improves the body's use of the hormone insulin by:  Increasing the body's insulin sensitivity.  Reducing the body's insulin needs.  Decreases the risk for heart disease because exercising:  Lowers cholesterol and triglycerides levels.  Increases the levels of good cholesterol (such as high-density lipoproteins [HDL]) in the body.  Lowers blood glucose levels. YOUR ACTIVITY PLAN  Choose an activity that you enjoy and set realistic goals. Your health care provider or diabetes educator can help you make an activity plan that works for you. Exercise regularly as directed by your health care provider. This includes:  Performing resistance training twice a week such as push-ups, sit-ups, lifting weights, or using resistance bands.  Performing 150 minutes of cardio exercises each week such as walking, running, or playing sports.  Staying active and spending no more than 90 minutes at one time being inactive. Even short bursts of exercise are  good for you. Three 10-minute sessions spread throughout the day are just as beneficial as a single 30-minute session. Some exercise ideas include:  Taking the dog for a walk.  Taking the stairs instead of the elevator.  Dancing to your favorite song.  Doing an exercise video.  Doing your favorite exercise with a friend. RECOMMENDATIONS FOR EXERCISING WITH TYPE 1 OR TYPE 2 DIABETES   Check your blood glucose before exercising. If blood glucose levels are greater than 240 mg/dL, check for urine ketones. Do not exercise if ketones are present.  Avoid injecting insulin into areas of the body that are going to be exercised. For example, avoid injecting insulin into:  The arms when playing tennis.  The legs when jogging.  Keep a record of:  Food intake before and after you exercise.  Expected peak times of insulin action.  Blood glucose levels before and after you exercise.  The type and amount of exercise you have done.  Review your records with your health care provider. Your health care provider will help you to develop guidelines for adjusting food intake and insulin amounts before and after exercising.  If you take insulin or oral hypoglycemic agents, watch for signs and symptoms of hypoglycemia. They include:  Dizziness.  Shaking.  Sweating.  Chills.  Confusion.  Drink plenty of water while you exercise to prevent dehydration or heat stroke. Body water is lost during exercise and must be replaced.  Talk to your health care provider before starting an exercise program to make sure it is safe for you. Remember, almost any type of activity is better than none. Document Released: 11/17/2003 Document Revised: 01/11/2014 Document Reviewed: 02/03/2013 [facility] Patient Information 2015 [place], [place]. This information is not intended to replace advice given to you by your health care provider. Make sure you discuss any questions you have with your health care  provider.  Basic Carbohydrate Counting for Diabetes Mellitus Carbohydrate counting is a method for keeping track of the amount of carbohydrates you eat. Eating carbohydrates naturally increases the level of sugar (glucose) in your blood, so it is important for you to know the amount that is okay for you to have in every meal. Carbohydrate counting helps keep the level of glucose in your blood within normal limits. The amount of carbohydrates allowed is different for every person. A dietitian can help you calculate the amount that is right for you. Once you know the amount of carbohydrates you can have, you can count the carbohydrates in the foods you want to eat. Carbohydrates are found in the following foods:  Grains, such as breads and cereals.  Dried beans and soy products.  Starchy vegetables, such as potatoes, peas, and corn.  Fruit and fruit juices.  Milk and yogurt.  Sweets and snack foods, such as cake, cookies, candy, chips, soft drinks, and fruit drinks. CARBOHYDRATE COUNTING There are two ways to count the carbohydrates in your food. You can use either of the methods or a combination of both. Reading the "Nutrition Facts" on [place] The "Nutrition Facts" is an area that is included on the labels of almost all packaged food and beverages in the [place]. It includes the serving size of that food or beverage and information about the nutrients in each serving of the food, including the grams (g) of carbohydrate per serving.  Decide the number of servings of this food or beverage that you will be able to eat or drink. Multiply that number of servings by the number of grams of carbohydrate that is listed on the label for that serving. The total will be the amount of carbohydrates you will be having when you eat or drink this food or beverage. Learning Standard Serving Sizes of Food When you eat food that is not packaged or does not include "Nutrition Facts" on the label, you  need to measure the servings in order to count the amount of carbohydrates.A serving of most carbohydrate-rich foods contains about 15 g of carbohydrates. The following list includes serving sizes of carbohydrate-rich foods that provide 15 g ofcarbohydrate per serving:   1 slice of bread (1 oz) or 1 six-inch tortilla.    of a hamburger bun or English muffin.  4-6 crackers.   cup unsweetened dry cereal.    cup hot cereal.   cup rice or pasta.    cup mashed potatoes or  of a large baked potato.  1 cup fresh fruit or one small piece of fruit.    cup canned or frozen fruit or fruit juice.  1 cup milk.   cup plain fat-free yogurt or yogurt sweetened with artificial sweeteners.   cup cooked dried beans or starchy vegetable, such as peas, corn, or potatoes.  Decide the number of standard-size servings that you  will eat. Multiply that number of servings by 15 (the grams of carbohydrates in that serving). For example, if you eat 2 cups of strawberries, you will have eaten 2 servings and 30 g of carbohydrates (2 servings x 15 g = 30 g). For foods such as soups and casseroles, in which more than one food is mixed in, you will need to count the carbohydrates in each food that is included. EXAMPLE OF CARBOHYDRATE COUNTING Sample Dinner  3 oz chicken breast.   cup of brown rice.   cup of corn.  1 cup milk.   1 cup strawberries with sugar-free whipped topping.  Carbohydrate Calculation Step 1: Identify the foods that contain carbohydrates:   Rice.   Corn.   Milk.   Strawberries. Step 2:Calculate the number of servings eaten of each:   2 servings of rice.   1 serving of corn.   1 serving of milk.   1 serving of strawberries. Step 3: Multiply each of those number of servings by 15 g:   2 servings of rice x 15 g = 30 g.   1 serving of corn x 15 g = 15 g.   1 serving of milk x 15 g = 15 g.   1 serving of strawberries x 15 g = 15 g. Step 4:  Add together all of the amounts to find the total grams of carbohydrates eaten: 30 g + 15 g + 15 g + 15 g = 75 g. Document Released: 08/27/2005 Document Revised: 01/11/2014 Document Reviewed: 07/24/2013 [facility] Patient Information 2015 [place], [place]. This information is not intended to replace advice given to you by your health care provider. Make sure you discuss any questions you have with your health care provider.  Sitz Bath A sitz bath is a warm water bath taken in the sitting position that covers only the hips and buttocks. It may be used for either healing or hygiene purposes. Sitz baths are also used to relieve pain, itching, or muscle spasms. The water may contain medicine. Moist heat will help you heal and relax.  HOME CARE INSTRUCTIONS  Take 3 to 4 sitz baths a day.  Fill the bathtub half full with warm water.  Sit in the water and open the drain a little.  Turn on the warm water to keep the tub half full. Keep the water running constantly.  Soak in the water for 15 to 20 minutes.  After the sitz bath, pat the affected area dry first. SEEK MEDICAL CARE IF:  You get worse instead of better. Stop the sitz baths if you get worse. MAKE SURE YOU:  Understand these instructions.  Will watch your condition.  Will get help right away if you are not doing well or get worse. Document Released: 05/19/2004 Document Revised: 05/21/2012 Document Reviewed: 11/24/2010 [facility] Patient Information 2015 [place], [place]. This information is not intended to replace advice given to you by your health care provider. Make sure you discuss any questions you have with your health care provider.  Peri-Rectal Abscess Your caregiver has diagnosed you as having a peri-rectal abscess. This is an infected area near the rectum that is filled with pus. If the abscess is near the surface of the skin, your caregiver may open (incise) the area and drain the pus. HOME CARE INSTRUCTIONS   If your abscess  was opened up and drained. A small piece of gauze may be placed in the opening so that it can drain. Do not remove the gauze  unless directed by your caregiver.  A loose dressing may be placed over the abscess site. Change the dressing as often as necessary to keep it clean and dry.  After the drain is removed, the area may be washed with a gentle antiseptic (soap) four times per day.  A warm sitz bath, warm packs or heating pad may be used for pain relief, taking care not to burn yourself.  Return for a wound check in 1 day or as directed.  An "inflatable doughnut" may be used for sitting with added comfort. These can be purchased at a drugstore or medical supply house.  To reduce pain and straining with bowel movements, eat a high fiber diet with plenty of fruits and vegetables. Use stool softeners as recommended by your caregiver. This is especially important if narcotic type pain medications were prescribed as these may cause marked constipation.  Only take over-the-counter or prescription medicines for pain, discomfort, or fever as directed by your caregiver. SEEK IMMEDIATE MEDICAL CARE IF:   You have increasing pain that is not controlled by medication.  There is increased inflammation (redness), swelling, bleeding, or drainage from the area.  An oral temperature above 102 F (38.9 C) develops.  You develop chills or generalized malaise (feel lethargic or feel "washed out").  You develop any new symptoms (problems) you feel may be related to your present problem. Document Released: 08/24/2000 Document Revised: 11/19/2011 Document Reviewed: 08/24/2008 [facility] Patient Information 2015 [place], [place]. This information is not intended to replace advice given to you by your health care provider. Make sure you discuss any questions you have with your health care provider.

## 2014-12-01 NOTE — Progress Notes (Signed)
Inpatient Diabetes Program Recommendations  AACE/ADA: New Consensus Statement on Inpatient Glycemic Control (2013)  Target Ranges:  Prepandial:   less than 140 mg/dL      Peak postprandial:   less than 180 mg/dL (1-2 hours)      Critically ill patients:  140 - 180 mg/dL   Reviewed insulin pen use with patient and wife.  Demonstrated use and allowed patient to return demonstration. Discussed insulin pen storage, Insulin pen needles, Priming of insulin pen, how to inject and the importance of holding in place for 6-10 seconds after injection.  Patient and wife were both able to demonstrate use.  Also reviewed hypoglycemia symptoms, and treatment.  Patient was able to teach back.  Also discussed monitoring and the importance of follow-up with PCP.  Discussed insulin types and the need for patient to eat with the Novolog doses.    Thanks, Adah Perl, RN, BC-ADM Inpatient Diabetes Coordinator Pager [phone]

## 2014-12-01 NOTE — Progress Notes (Signed)
[facility] PROGRESS NOTE  Vincent Mcintyre MKL:[id number] DOB: 1966-03-29 DOA: 11/29/2014  PCP: Gerrit Heck, MD  Subjective: Patient denied any complaints this morning. No nausea, vomiting. No abdominal pain. He was educated regarding his diabetes. He was explained the need for insulin from now onwards.  Objective: Vital Signs  Filed Vitals:   11/30/14 2146 12/01/14 0518 12/01/14 0748 12/01/14 1016  BP: 119/75 111/75 122/81 132/78  Pulse: 85 68 80   Temp: 98.4 F (36.9 C) 97.3 F (36.3 C) 98.2 F (36.8 C)   TempSrc: Oral Oral Oral   Resp: 17 17 16    Height:      Weight:      SpO2: 97% 98% 94%     Intake/Output Summary (Last 24 hours) at 12/01/14 1517 Last data filed at 11/30/14 2147  Gross per 24 hour  Intake    270 ml  Output      0 ml  Net    270 ml   Filed Weights   11/29/14 6979 11/29/14 1548  Weight: 127.007 kg (280 lb) 127.007 kg (280 lb)    General appearance: alert, cooperative, appears stated age and no distress Resp: clear to auscultation bilaterally Cardio: regular rate and rhythm, S1, S2 normal, no murmur, click, rub or gallop GI: soft, non-tender; bowel sounds normal; no masses,  no organomegaly Extremities: extremities normal, atraumatic, no cyanosis or edema  Lab Results:  Basic Metabolic Panel:  Recent Labs Lab 11/25/14 1932 11/29/14 1119  NA 133* 137  K 4.0 3.8  CL 96 101  CO2 26 27  GLUCOSE 309* 239*  BUN 25* 14  CREATININE 1.10 0.76  CALCIUM 9.3 8.8   Liver Function Tests:  Recent Labs Lab 11/25/14 1932 11/29/14 1119  AST 35 23  ALT 47 33  ALKPHOS 44 48  BILITOT 1.4* 1.0  PROT 6.9 6.3  ALBUMIN 3.9 3.3*   CBC:  Recent Labs Lab 11/25/14 1932 11/29/14 1119 11/30/14 0432 12/01/14 0544  WBC 12.7* 9.7 12.2* 10.5  NEUTROABS 9.8*  --   --   --   HGB 16.3 14.1 14.0 13.2  HCT 45.2 40.2 41.3 39.2  MCV 83.2 84.6 85.0 86.2  PLT 214 197 233 221   CBG:  Recent Labs Lab 11/30/14 0752 11/30/14 1144  11/30/14 1655 11/30/14 2144 12/01/14 0746  GLUCAP 239* 296* 181* 158* 124*    Recent Results (from the past 240 hour(s))  Anaerobic culture     Status: None (Preliminary result)   Collection Time: 11/29/14 12:07 PM  Result Value Ref Range Status   Specimen Description ABSCESS  Final   Special Requests PERIRECTAL  Final   Gram Stain PENDING  Incomplete   Culture   Final    NO ANAEROBES ISOLATED; CULTURE IN PROGRESS FOR 5 DAYS Performed at [facility]    Report Status PENDING  Incomplete  Culture, routine-abscess     Status: None   Collection Time: 11/29/14 12:07 PM  Result Value Ref Range Status   Specimen Description ABSCESS  Final   Special Requests PERIRECTAL  Final   Gram Stain   Final    ABUNDANT WBC PRESENT,BOTH PMN AND MONONUCLEAR NO SQUAMOUS EPITHELIAL CELLS SEEN RARE GRAM POSITIVE COCCI IN PAIRS IN CLUSTERS RARE GRAM NEGATIVE RODS Performed at [facility]    Culture   Final    MODERATE ESCHERICHIA COLI Performed at [facility]    Report Status 12/01/2014 FINAL  Final   Organism ID, Bacteria ESCHERICHIA COLI  Final  Susceptibility   Escherichia coli - MIC*    AMPICILLIN >=32 RESISTANT Resistant     AMPICILLIN/SULBACTAM >=32 RESISTANT Resistant     CEFAZOLIN <=4 SENSITIVE Sensitive     CEFEPIME <=1 SENSITIVE Sensitive     CEFTAZIDIME <=1 SENSITIVE Sensitive     CEFTRIAXONE <=1 SENSITIVE Sensitive     CIPROFLOXACIN <=0.25 SENSITIVE Sensitive     GENTAMICIN <=1 SENSITIVE Sensitive     IMIPENEM <=0.25 SENSITIVE Sensitive     PIP/TAZO <=4 SENSITIVE Sensitive     TOBRAMYCIN <=1 SENSITIVE Sensitive     TRIMETH/SULFA <=20 SENSITIVE Sensitive     * MODERATE ESCHERICHIA COLI      Studies/Results: No results found.  Medications:  Scheduled: . enoxaparin (LOVENOX) injection  40 mg Subcutaneous Q24H  . insulin aspart  0-20 Units Subcutaneous TID WC  . insulin aspart  8 Units Subcutaneous TID WC  . insulin glargine  25 Units  Subcutaneous Daily  . lisinopril  20 mg Oral Daily  . magnesium sulfate   Topical Daily  . metFORMIN  1,000 mg Oral BID WC  . piperacillin-tazobactam (ZOSYN)  IV  3.375 g Intravenous 3 times per day  . simvastatin  40 mg Oral q morning - 10a   Continuous: . 0.9 % NaCl with KCl 20 mEq / L 10 mL/hr at 11/30/14 8891   QXI:[id number] **OR** acetaminophen, diphenhydrAMINE **OR** diphenhydrAMINE, HYDROcodone-acetaminophen, ondansetron, senna-docusate  Assessment/Plan:  Active Problems:   Perirectal abscess   Uncontrolled diabetes mellitus   Severe obesity (BMI >= 40)    Uncontrolled diabetes mellitus type II Patient's HbA1c was more than 11. He is on 2 different oral hypoglycemic agents at home. His diabetes is very poorly controlled. So he was started on Lantus along with NovoLog. Blood sugars have been better controlled in the last 24 hours. Patient was provided education regarding this change. He was asked to follow-up with his primary care physician within the next 7-10 days. He was asked to continue to check his blood sugars at home on a regular basis and maintain a record of the same. He has been asked to discontinue his glyburide. He may continue his metformin for now.  Essential Hypertension Currently controlled. May resume his home medications.  Dyslipidemia Continue statins on discharge  Perirectal abscess-status post incision and debridement We will defer to primary service  Prescriptions have been printed off for Lantus as well as NovoLog. Patient's questions were answered to his satisfaction. Discussed also with the physician assistant with surgery.   LOS: 1 day   [facility] Pager [phone] 12/01/2014, 3:17 PM  If 7PM-7AM, please contact night-coverage at [email or web address], password [facility]

## 2014-12-01 NOTE — Discharge Summary (Signed)
[facility] Discharge Summary   Patient ID: Vincent Mcintyre MRN: [id number] DOB/AGE: 01/07/1966 49 y.o.  Admit date: 11/29/2014 Discharge date: 12/01/2014  Admitting Diagnosis: Uncontrolled diabetes mellitus Severe obesity BMI >40 Perirectal abscess  Discharge Diagnosis Patient Active Problem List   Diagnosis Date Noted  . Uncontrolled diabetes mellitus 12/01/2014  . Severe obesity (BMI >= 40) 12/01/2014  . Perirectal abscess 11/29/2014    Consultants Dr. Sloan Leiter, Dr. Maryland Pink (Internal medicine regarding Uncontrolled diabetes) Diabetes nurse educator   Imaging: No results found.  Procedures Dr. Rosendo Gros (11/29/14) - Incision and drainage of perirectal abscess  Hospital Course:  49 yo white male who began having rectal pain last Tuesday. He went to see his PCP who felt he had a perirectal abscess and sent him to [facility]. Upon arrival here he had a CT scan that was negative for an abscess, although looking back he may have had the small beginnings of one. He was diagnosed with an anal fissure and sent home. He returned today due to continued pain. He now has a significant amount of cellulitis on his left gluteus and some drainage. He has pain with sitting, but no pain with bowel movements. He has had no issues with constipation. We have been asked to see him.  Patient hasn't worked in 3 years due to his back issues.   Workup showed perirectal abscess.  Patient was admitted and underwent procedure listed above.  Tolerated procedure well and was transferred to the floor.  Diet was advanced as tolerated.  Pain medication was weaned to orals.  Upon admission his blood sugars were 200-300's.  His Hgb A1C was 11.3.  He has been taking glyburide and metformin.  He said blood sugars are usually between 170-180.  His PCP is Dr. Drema Dallas.  We had Internal medicine consult on the patient and they started him on Lantus and Novolog.  They discontinued his Glyburide, but continued his  metformin.  He was seen and educated by the diabetes nurse educator.  Dr. Maryland Pink has given his recommendations for insulin dosage as below.  The diabetes medications may need to be adjusted by his PCP and he knows to get a follow up appointment within 1-2 weeks.  On POD #2, the patient was voiding well, tolerating diet, ambulating well, pain well controlled, vital signs stable, incisions c/d/i and felt stable for discharge home.  He should continue dressing changes with 1/2 inch iodoform gauze packing strips until the wound is healed.  He is encouraged to clean the area with soap and water twice a day and do sitz baths.  Patient will follow up in our office in 2-3 weeks and knows to call with questions or concerns.   Physical Exam: General:  Alert, NAD, pleasant, comfortable Buttock: 1cm incision with 3cm deep abscess cavity, swelling improved, erythema improved, tender to palpation.     Medication List    STOP taking these medications        glyBURIDE 5 MG tablet  Commonly known as:  DIABETA     naproxen sodium 220 MG tablet  Commonly known as:  ANAPROX      TAKE these medications        amoxicillin-clavulanate 875-125 MG per tablet  Commonly known as:  AUGMENTIN  Take 1 tablet by mouth 2 (two) times daily.     HYDROcodone-acetaminophen 5-325 MG per tablet  Commonly known as:  NORCO/VICODIN  Take 1-2 tablets by mouth every 6 (six) hours as needed for moderate pain.  insulin aspart 100 UNIT/ML FlexPen  Commonly known as:  NOVOLOG  Inject 5 Units into the skin 3 (three) times daily with meals.     Insulin Glargine 100 UNIT/ML Solostar Pen  Commonly known as:  LANTUS  Inject 25 Units into the skin daily with breakfast.     Insulin Pen Needle 29G X 5MM Misc  Use with insulin pens as directed     lisinopril-hydrochlorothiazide 20-25 MG per tablet  Commonly known as:  PRINZIDE,ZESTORETIC  Take 1 tablet by mouth daily.     magnesium sulfate crystals  Commonly known as:   EPSOM SALTS  Apply topically daily.     metFORMIN 500 MG tablet  Commonly known as:  GLUCOPHAGE  Take 1,000 mg by mouth 2 (two) times daily with a meal.     naproxen 375 MG tablet  Commonly known as:  NAPROSYN  Take 375 mg by mouth every evening.     polyethylene glycol powder powder  Commonly known as:  MIRALAX  Take 8.5-34 g by mouth 2 (two) times daily. To correct constipation.  Adjust dose over 1-2 months.  Goal = ~1 bowel movement / day     senna-docusate 8.6-50 MG per tablet  Commonly known as:  Senokot-S  Take 1 tablet by mouth 2 (two) times daily.     simvastatin 40 MG tablet  Commonly known as:  ZOCOR  Take 40 mg by mouth every morning.         Follow-up Information    Follow up with Gerrit Heck, MD. Schedule an appointment as soon as possible for a visit in 1 week.   Specialty:  Family Medicine   Why:  for further management of diabetes.   Contact information:   [place] [address] [phone]       Follow up with Rosario Jacks., Anne Hahn, MD. Schedule an appointment as soon as possible for a visit in 3 weeks.   Specialty:  General Surgery   Why:  For post-operation check with Dr. Rosendo Gros within 2-3 weeks from now.   Call office to confirm date/time.   Contact information:   [place]  [place] [address] [phone]       Signed: Coralie Keens [facility] [phone]  12/01/2014, 8:53 AM

## 2014-12-01 NOTE — Progress Notes (Signed)
Discharge home. Home discharge instruction given, no questions verbalized. 

## 2014-12-01 NOTE — Plan of Care (Addendum)
Problem: Food- and Nutrition-Related Knowledge Deficit (NB-1.1) Goal: Nutrition education Formal process to instruct or train a patient/client in a skill or to impart knowledge to help patients/clients voluntarily manage or modify food choices and eating behavior to maintain or improve health. Outcome: Adequate for Discharge RD was consulted for Diabetes Mellitus diet education. Dietetic intern provided the handout on "Carbohydrate Counting for People with Diabetes" from [employer]. Pt had pretty good knowledge about sources of carbohydrates, reading food labels and portions from previous diabetes classes and videos that he completed when first diagnosed with DM about 3 years ago. He admits that  exercise has been difficult lately due to back injury. Pt disclosed that he lost his job which resulted in food insecurity. However, family makes an effort to purchase healthier options such as whole grains and opt for healthier snacks. Dietetic Intern walked through using MyPlate as a guide for meal planing, encouraged eating every few hours and not skipping meals, and importance of testing blood sugar regularly. Pt keeps a blood sugar log. Intern encouraged to also keep a food diary for more accurate carb counting.  Pt disclosed that his mom and brother also have DM with complications. Pt expressed desire to control his diabetes to prevent similar complications. Expect pretty good compliance, especially if outside support is available.  Rasma A. Bandon Intern 12/01/2014 10:19 AM        I agree with intern's assessment. Case reviewed and appropriate revisions made.  Malaiyah Achorn A. Jimmye Norman, RD, LDN, CDE Pager: [phone] After hours Pager: [phone]

## 2014-12-04 LAB — ANAEROBIC CULTURE

## 2014-12-07 ENCOUNTER — Encounter (HOSPITAL_COMMUNITY): Payer: Self-pay | Admitting: Emergency Medicine

## 2014-12-07 ENCOUNTER — Emergency Department (HOSPITAL_COMMUNITY)
Admission: EM | Admit: 2014-12-07 | Discharge: 2014-12-07 | Disposition: A | Discharge status: Home / Self Care | Payer: Medicaid Other | Attending: Emergency Medicine | Admitting: Emergency Medicine

## 2014-12-07 ENCOUNTER — Other Ambulatory Visit: Payer: Self-pay

## 2014-12-07 DIAGNOSIS — Z794 Long term (current) use of insulin: Secondary | ICD-10-CM | POA: Diagnosis not present

## 2014-12-07 DIAGNOSIS — Z792 Long term (current) use of antibiotics: Secondary | ICD-10-CM | POA: Diagnosis not present

## 2014-12-07 DIAGNOSIS — R739 Hyperglycemia, unspecified: Secondary | ICD-10-CM

## 2014-12-07 DIAGNOSIS — Z79899 Other long term (current) drug therapy: Secondary | ICD-10-CM | POA: Insufficient documentation

## 2014-12-07 DIAGNOSIS — I959 Hypotension, unspecified: Secondary | ICD-10-CM | POA: Diagnosis not present

## 2014-12-07 DIAGNOSIS — I10 Essential (primary) hypertension: Secondary | ICD-10-CM | POA: Insufficient documentation

## 2014-12-07 DIAGNOSIS — Z8719 Personal history of other diseases of the digestive system: Secondary | ICD-10-CM | POA: Diagnosis not present

## 2014-12-07 DIAGNOSIS — E1165 Type 2 diabetes mellitus with hyperglycemia: Secondary | ICD-10-CM | POA: Diagnosis not present

## 2014-12-07 DIAGNOSIS — E785 Hyperlipidemia, unspecified: Secondary | ICD-10-CM | POA: Insufficient documentation

## 2014-12-07 LAB — COMPREHENSIVE METABOLIC PANEL
ALBUMIN: 4 g/dL (ref 3.5–5.2)
ALT: 74 U/L — ABNORMAL HIGH (ref 0–53)
AST: 54 U/L — ABNORMAL HIGH (ref 0–37)
Alkaline Phosphatase: 36 U/L — ABNORMAL LOW (ref 39–117)
Anion gap: 11 (ref 5–15)
BUN: 28 mg/dL — AB (ref 6–23)
CHLORIDE: 97 mmol/L (ref 96–112)
CO2: 28 mmol/L (ref 19–32)
Calcium: 9.5 mg/dL (ref 8.4–10.5)
Creatinine, Ser: 1.15 mg/dL (ref 0.50–1.35)
GFR calc Af Amer: 86 mL/min — ABNORMAL LOW (ref >90)
GFR, EST NON AFRICAN AMERICAN: 74 mL/min — AB (ref >90)
Glucose, Bld: 200 mg/dL — ABNORMAL HIGH (ref 70–99)
POTASSIUM: 4 mmol/L (ref 3.5–5.1)
SODIUM: 136 mmol/L (ref 135–145)
Total Bilirubin: 0.8 mg/dL (ref 0.3–1.2)
Total Protein: 7.2 g/dL (ref 6.0–8.3)

## 2014-12-07 LAB — I-STAT TROPONIN, ED: Troponin i, poc: 0 ng/mL (ref 0.00–0.08)

## 2014-12-07 LAB — CBC
HCT: 47.4 % (ref 39.0–52.0)
HEMOGLOBIN: 16.2 g/dL (ref 13.0–17.0)
MCH: 29.3 pg (ref 26.0–34.0)
MCHC: 34.2 g/dL (ref 30.0–36.0)
MCV: 85.7 fL (ref 78.0–100.0)
PLATELETS: 269 10*3/uL (ref 150–400)
RBC: 5.53 MIL/uL (ref 4.22–5.81)
RDW: 12.7 % (ref 11.5–15.5)
WBC: 12 10*3/uL — ABNORMAL HIGH (ref 4.0–10.5)

## 2014-12-07 LAB — CBG MONITORING, ED: GLUCOSE-CAPILLARY: 190 mg/dL — AB (ref 70–99)

## 2014-12-07 LAB — I-STAT CG4 LACTIC ACID, ED: Lactic Acid, Venous: 1.71 mmol/L (ref 0.5–2.0)

## 2014-12-07 MED ORDER — SODIUM CHLORIDE 0.9 % IV BOLUS (SEPSIS)
1000.0000 mL | Freq: Once | INTRAVENOUS | Status: AC
  Administered 2014-12-07: 1000 mL via INTRAVENOUS

## 2014-12-07 NOTE — ED Provider Notes (Signed)
CSN: [id number]     Arrival date & time 12/07/14  1713 History   First MD Initiated Contact with Patient 12/07/14 1720     Chief Complaint  Patient presents with  . Hypotension     (Consider location/radiation/quality/duration/timing/severity/associated sxs/prior Treatment) HPI Comments: 49 year old male with a history of diabetes, hypertension and hypercholesterol, admitted recently for perirectal abscess and underwent surgical drainage who presents to the hospital today because of hypotension and lightheadedness. He has had approximately 4 days of intermittent symptoms of lightheadedness feeling near syncopal when he stands too quickly, no associated chest pain, shortness of breath, fever, chills, diarrhea, dysuria, nausea or vomiting, swelling, rashes. His pain is significantly improved around the abscess site, he was at his doctor's office for a follow-up today at the family doctor, Dr. Leighton Ruff when she noted that he was hypotensive and sent him to the hospital for further evaluation. The patient denies any symptoms at this time when he is lying down, he does report that when he stopped taking his lisinopril several days ago his symptoms went away, he has been taking insulin which is new for him since being discharged from the hospital.  The history is provided by the patient.    Past Medical History  Diagnosis Date  . Hypertension   . Diabetes mellitus      Dr. Leighton Ruff - [facility] PCP  . Hyperlipidemia    Past Surgical History  Procedure Laterality Date  . Incision and drainage perirectal abscess  11/29/2014  . Incision and drainage perirectal abscess N/A 11/29/2014    Procedure: IRRIGATION AND DEBRIDEMENT PERIRECTAL ABSCESS;  Surgeon: Ralene Ok, MD;  Location: [facility];  Service: General;  Laterality: N/A;   History reviewed. No pertinent family history. History  Substance Use Topics  . Smoking status: Never Smoker   . Smokeless tobacco: Never Used  . Alcohol  Use: No    Review of Systems  All other systems reviewed and are negative.     Allergies  Review of patient's allergies indicates no known allergies.  Home Medications   Prior to Admission medications   Medication Sig Start Date End Date Taking? Authorizing Provider  Docusate Calcium (STOOL SOFTENER PO) Take 1 capsule by mouth daily.   Yes Historical Provider, MD  HYDROcodone-acetaminophen (NORCO/VICODIN) 5-325 MG per tablet Take 1-2 tablets by mouth every 6 (six) hours as needed for moderate pain. 12/01/14  Yes Megan N Dort, PA-C  insulin aspart (NOVOLOG) 100 UNIT/ML FlexPen Inject 5 Units into the skin 3 (three) times daily with meals. 12/01/14  Yes Bonnielee Haff, MD  Insulin Glargine (LANTUS) 100 UNIT/ML Solostar Pen Inject 25 Units into the skin daily with breakfast. 12/01/14  Yes Bonnielee Haff, MD  Insulin Pen Needle 29G X 5MM MISC Use with insulin pens as directed 12/01/14  Yes Bonnielee Haff, MD  metFORMIN (GLUCOPHAGE) 500 MG tablet Take 1,000 mg by mouth 2 (two) times daily with a meal.   Yes Historical Provider, MD  naproxen (NAPROSYN) 375 MG tablet Take 375 mg by mouth every evening.   Yes Historical Provider, MD  simvastatin (ZOCOR) 40 MG tablet Take 40 mg by mouth every morning. 10/17/14  Yes Historical Provider, MD  amoxicillin-clavulanate (AUGMENTIN) 875-125 MG per tablet Take 1 tablet by mouth 2 (two) times daily. Patient not taking: Reported on 12/07/2014 12/01/14   Megan N Dort, PA-C  magnesium sulfate (EPSOM SALTS) crystals Apply topically daily. 12/01/14   Megan N Dort, PA-C  polyethylene glycol powder (MIRALAX) powder Take 8.5-34  g by mouth 2 (two) times daily. To correct constipation.  Adjust dose over 1-2 months.  Goal = ~1 bowel movement / day 12/01/14   Megan N Dort, PA-C  senna-docusate (SENOKOT-S) 8.6-50 MG per tablet Take 1 tablet by mouth 2 (two) times daily. 12/01/14   Megan N Dort, PA-C   BP 110/69 mmHg  Pulse 71  Temp(Src) 97.7 F (36.5 C) (Oral)  Resp 18   SpO2 100% Physical Exam  Constitutional: He appears well-developed and well-nourished. No distress.  HENT:  Head: Normocephalic and atraumatic.  Mouth/Throat: Oropharynx is clear and moist. No oropharyngeal exudate.  Eyes: Conjunctivae and EOM are normal. Pupils are equal, round, and reactive to light. Right eye exhibits no discharge. Left eye exhibits no discharge. No scleral icterus.  Neck: Normal range of motion. Neck supple. No JVD present. No thyromegaly present.  Cardiovascular: Normal rate, regular rhythm, normal heart sounds and intact distal pulses.  Exam reveals no gallop and no friction rub.   No murmur heard. Pulmonary/Chest: Effort normal and breath sounds normal. No respiratory distress. He has no wheezes. He has no rales.  Abdominal: Soft. Bowel sounds are normal. He exhibits no distension and no mass. There is no tenderness.  Genitourinary:  Left perirectal abscess spontaneously draining, packing in place, no surrounding redness, no surrounding tenderness  Musculoskeletal: Normal range of motion. He exhibits no edema or tenderness.  Lymphadenopathy:    He has no cervical adenopathy.  Neurological: He is alert. Coordination normal.  Skin: Skin is warm and dry. No rash noted. No erythema.  Psychiatric: He has a normal mood and affect. His behavior is normal.  Nursing note and vitals reviewed.   ED Course  Procedures (including critical care time) Labs Review Labs Reviewed  COMPREHENSIVE METABOLIC PANEL - Abnormal; Notable for the following:    Glucose, Bld 200 (*)    BUN 28 (*)    AST 54 (*)    ALT 74 (*)    Alkaline Phosphatase 36 (*)    GFR calc non Af Amer 74 (*)    GFR calc Af Amer 86 (*)    All other components within normal limits  CBC - Abnormal; Notable for the following:    WBC 12.0 (*)    All other components within normal limits  CBG MONITORING, ED - Abnormal; Notable for the following:    Glucose-Capillary 190 (*)    All other components within  normal limits  I-STAT CG4 LACTIC ACID, ED  I-STAT TROPOININ, ED    Imaging Review No results found.  ED ECG REPORT  I personally interpreted this EKG   Date: 12/07/2014   Rate: 85  Rhythm: normal sinus rhythm  QRS Axis: normal  Intervals: normal  ST/T Wave abnormalities: normal  Conduction Disutrbances:none  Narrative Interpretation:   Old EKG Reviewed: none available  MDM   Final diagnoses:  Hypotension, unspecified hypotension type  Hyperglycemia    The patient is mildly hypotensive, pressure 95 systolic, he is not tachycardic, he does not appear ill, we'll check CBG, give fluids, check labs, electrolytes, cardiac function, emergency department observation.  Blood pressure remained in a normal range after IV fluids, he was able to ambulate, take oral fluids and orthostatic vital signs were repeated without any significant changes. I will ask him to hold his blood pressure medications until follow-up with his family doctor this week, other labs don't suggest any other sources of hypotension, he appears stable for discharge.  Meds given in ED:  Medications  sodium chloride 0.9 % bolus 1,000 mL (0 mLs Intravenous Stopped 12/07/14 1850)    New Prescriptions   No medications on file      Noemi Chapel, MD 12/07/14 2043

## 2014-12-07 NOTE — Discharge Instructions (Signed)
Please call your doctor for a followup appointment within 24-48 hours. When you talk to your doctor please let them know that you were seen in the emergency department and have them acquire all of your records so that they can discuss the findings with you and formulate a treatment plan to fully care for your new and ongoing problems. ° °

## 2014-12-07 NOTE — ED Notes (Signed)
Per EMS: Pt from [facility].  Sent for hypotension of 80s/40s. States that he had surgery on his rectum for a rectal abscess.  It was drained on Monday 3/21.  Started feeling dizzy and faint upon standing.  Called his son's home health nurse (son is hemophiliac) and told her that his BP was 70/30 on home monitor.  Pt states that the home health nurse told him to rest and that he probably just had orthostatic hypotension.  Pt states that he hasn't felt better and went to UC today where he was found to be hypotensive at 80/40 and EMS was called.  En route pt was given a 500 mL bolus.  20 g in Lt hand.

## 2014-12-27 ENCOUNTER — Encounter: Payer: Medicaid Other | Attending: Family Medicine | Admitting: Dietician

## 2014-12-27 ENCOUNTER — Encounter: Payer: Self-pay | Admitting: Dietician

## 2014-12-27 DIAGNOSIS — Z713 Dietary counseling and surveillance: Secondary | ICD-10-CM | POA: Insufficient documentation

## 2014-12-27 DIAGNOSIS — K611 Rectal abscess: Secondary | ICD-10-CM | POA: Insufficient documentation

## 2014-12-27 DIAGNOSIS — E1165 Type 2 diabetes mellitus with hyperglycemia: Secondary | ICD-10-CM | POA: Insufficient documentation

## 2014-12-27 DIAGNOSIS — Z6839 Body mass index (BMI) 39.0-39.9, adult: Secondary | ICD-10-CM | POA: Diagnosis not present

## 2014-12-27 DIAGNOSIS — Z794 Long term (current) use of insulin: Secondary | ICD-10-CM | POA: Diagnosis not present

## 2014-12-27 DIAGNOSIS — IMO0002 Reserved for concepts with insufficient information to code with codable children: Secondary | ICD-10-CM

## 2014-12-27 NOTE — Patient Instructions (Signed)
Be mindful of portion size Increase fiber.  Have a lot of non starchy vegetables daily.  Include dried beans daily. Aim for 3-4 Carb Choices per meal (45-60 grams) +/- 1 either way  Aim for 0-2 Carbs per snack if hungry  Include protein in moderation with your meals and snacks Consider reading food labels for Total Carbohydrate and Fat Grams of foods Consider  increasing your activity level by armchair exercises or walking for 15 minutes daily as tolerated

## 2014-12-27 NOTE — Progress Notes (Addendum)
Medical Nutrition Therapy:  Appt start time: 5102 end time:  1615.   Assessment:  Primary concerns today: Patient is here today with his wife.  Patient would like to learn how to better manage his diabetes.  States that he stays on track with DM control for a while and sways off due to finances, stress, frustration, illness, anger. Patient checks his blood sugar up to 6 x per day (before and after meals)   Blood sugar has been about 130-160 overall since d/c from hospital about 2 weeks ago.  Patient has type 2 DM for the past 4 years.  Hx also includes surgery for rectal abscess with surgery 2 weeks ago.  UBW:  279 lbs decreased to 267 during hospitalization and 273 lbs today.  HgbA1C per wife was 11.3% 11/29/14, 11.8% 04/26/14, 8.6% 02/19/13.  Mother has type 2 DM as well as brother who is close to needing dialysis.     Patient lives with wife and son.  Son has hemophilia.  Wife and patient share shopping and cooking.  They are active evenings with sons soccer practice.  Patient is unemployed for the past 3 years due to a back injury.  He takes steroid injections at times.    Preferred Learning Style:   No preference indicated   Learning Readiness:   Ready  Change in progress   MEDICATIONS: see list to include novolog, lantus, and glucophage   DIETARY INTAKE: States that he does not like fruit much.  Will eat watermelon, apple, pear or 1/2 banana Occasional sweets with Sunday dinner.    24-hr recall:  B (8:30 AM): 2 eggs, [place] sausage or  1/2 of leftover pork chop with toast and leftover potatoes, 8 oz milk, 8 oz OJ or 1 cupmulti grain cheerios or raisin bran crunch with milk Snk ( AM): none  L (11-1 PM): leftovers or chicken salad on [employer] bread and soup or chef salad or [place] sandwich with handful potato chips or side salad Snk ( PM): trail mix (almonds, raisins, craisins, chocolate chips) D (4:30-5 PM): grilled chicken, mashed potatoes, salad, green beans or steak and vegetables,  slaw with fruit or lentils and greens with chicken, baked beans, and salad Snk ( PM): tries to avoid eating after 8:00.  If he eats it will be popcorn. Beverages: 20 oz milk, mio water, OJ, 2% milk  Usual physical activity: back injury.  Walking is very limited.    Estimated energy needs: 1600-1800 calories 180 g carbohydrates 120 g protein 44 g fat  Progress Towards Goal(s):  In progress.   Nutritional Diagnosis:  NB-1.1 Food and nutrition-related knowledge deficit As related to balance of carbohydrate, protein, and fat.  As evidenced by diet hx.    Intervention:  Nutrition counseling and diabetes education initiated. Discussed Carb Counting by food group as method of portion control, reading food labels, and benefits of increased activity. Also discussed basic physiology of Diabetes, target BG ranges pre and post meals, and A1c. Discussed need for high fiber diet and educated on high fiber foods.  Be mindful of portion size Increase fiber.  Have a lot of non starchy vegetables daily.  Include dried beans daily. Aim for 3-4 Carb Choices per meal (45-60 grams) +/- 1 either way  Aim for 0-2 Carbs per snack if hungry  Include protein in moderation with your meals and snacks Consider reading food labels for Total Carbohydrate and Fat Grams of foods Consider  increasing your activity level by armchair exercises or walking  for 15 minutes daily as tolerated   Teaching Method Utilized:  Visual Auditory Hands on  Handouts given during visit include:  Meal plan card  Label reading  Snack list  Living well with diabetes  HgbA1C sheet  Barriers to learning/adherence to lifestyle change: increased pain  Demonstrated degree of understanding via:  Teach Back   Monitoring/Evaluation:  Dietary intake, exercise, label reading, and body weight in 1 month(s).

## 2015-02-17 ENCOUNTER — Other Ambulatory Visit: Payer: Self-pay | Admitting: Surgery

## 2015-02-28 ENCOUNTER — Encounter: Payer: Medicaid Other | Attending: Family Medicine | Admitting: Dietician

## 2015-02-28 ENCOUNTER — Encounter: Payer: Self-pay | Admitting: Dietician

## 2015-02-28 DIAGNOSIS — Z713 Dietary counseling and surveillance: Secondary | ICD-10-CM | POA: Diagnosis not present

## 2015-02-28 DIAGNOSIS — K611 Rectal abscess: Secondary | ICD-10-CM | POA: Insufficient documentation

## 2015-02-28 DIAGNOSIS — Z6839 Body mass index (BMI) 39.0-39.9, adult: Secondary | ICD-10-CM | POA: Insufficient documentation

## 2015-02-28 DIAGNOSIS — Z794 Long term (current) use of insulin: Secondary | ICD-10-CM | POA: Insufficient documentation

## 2015-02-28 DIAGNOSIS — E1165 Type 2 diabetes mellitus with hyperglycemia: Secondary | ICD-10-CM | POA: Insufficient documentation

## 2015-02-28 DIAGNOSIS — IMO0002 Reserved for concepts with insufficient information to code with codable children: Secondary | ICD-10-CM

## 2015-02-28 NOTE — Progress Notes (Signed)
Medical Nutrition Therapy:  Appt start time: 1100 end time:  1130.  Assessment:  12/27/14 Primary concerns today: Patient is here today with his wife.  Patient would like to learn how to better manage his diabetes.  States that he stays on track with DM control for a while and sways off due to finances, stress, frustration, illness, anger. Patient checks his blood sugar up to 6 x per day (before and after meals)   Blood sugar has been about 130-160 overall since d/c from hospital about 2 weeks ago.  Patient has type 2 DM for the past 4 years.  Hx also includes surgery for rectal abscess with surgery 2 weeks ago.  UBW:  279 lbs decreased to 267 during hospitalization and 273 lbs today.  HgbA1C per wife was 11.3% 11/29/14, 11.8% 04/26/14, 8.6% 02/19/13.  Mother has type 2 DM as well as brother who is close to needing dialysis.     Patient lives with wife and son.  Son has hemophilia.  Wife and patient share shopping and cooking.  They are active evenings with sons soccer practice.  Patient is unemployed for the past 3 years due to a back injury.  He takes steroid injections at times.    02/28/15: Patient is here alone today.  He is in significant back pain.  Reports that he is unable to exercise.  Since last visit, he has had increased stress.  (His wife had knee surgery and was hospitalized with pneumonia and his son had strep throat.)  He has also developed another abscess this time on his tailbone.  Due to increased stress, he forgot to take his medications (primarily evening) at times.  We discussed placement of meds and tips to improve this.  Patient states that he is doing better at this time and does not desire to change this.  He is now concerned about fasting blood sugar which has increased from 120 to high 100's and low 200's.  States that his last HgbA1C was 8.3% at the beginning of this month.  This has improved.  Weight 272 lbs which is stable.  Patient has become more mindful about portion sizes  and food choices.  Money is limited.  He has been using the EBT card at the [employer] which will give him a voucher for more food.  He is learning how to prepare and eat different vegetables.  Son gets a bag of food at school each week as well.  Preferred Learning Style:   No preference indicated   Learning Readiness:   Ready  Change in progress   MEDICATIONS: see list to include novolog, lantus, and glucophage   DIETARY INTAKE: States that he does not like fruit much.  Will eat watermelon, apple, pear or 1/2 banana Occasional sweets with Sunday dinner.    24-hr recall: from April visit. B (8:30 AM): 2 eggs, [place] sausage or  1/2 of leftover pork chop with toast and leftover potatoes, 8 oz milk, 8 oz OJ or 1 cupmulti grain cheerios or raisin bran crunch with milk Snk ( AM): none  L (11-1 PM): leftovers or chicken salad on [employer] bread and soup or chef salad or [place] sandwich with handful potato chips or side salad Snk ( PM): trail mix (almonds, raisins, craisins, chocolate chips) D (4:30-5 PM): grilled chicken, mashed potatoes, salad, green beans or steak and vegetables, slaw with fruit or lentils and greens with chicken, baked beans, and salad Snk ( PM): tries to avoid eating after  8:00.  If he eats it will be popcorn. Beverages: 20 oz milk, mio water, OJ, 2% milk  Usual physical activity: back injury.  Walking is very limited.    Estimated energy needs: 1600-1800 calories 180 g carbohydrates 120 g protein 44 g fat  Progress Towards Goal(s):  In progress.   Nutritional Diagnosis:  NB-1.1 Food and nutrition-related knowledge deficit As related to balance of carbohydrate, protein, and fat.  As evidenced by diet hx.    Intervention:  Nutrition counseling and diabetes education continued.  Good job on the changes that you have made. Remember medications when scheduled. Be as active as you can.  Do something daily such as walking.  Relaxation exercises are  important as well.   Continue to be mindful about portion sizes. When still hungry after a meal go back for the non starchy vegetables.     Teaching Method Utilized:  Visual Auditory Hands on  Handouts given during visit include:  Meal plan card  Label reading  Snack list  Living well with diabetes  HgbA1C sheet  Barriers to learning/adherence to lifestyle change: increased pain  Demonstrated degree of understanding via:  Teach Back   Monitoring/Evaluation:  Dietary intake, exercise, label reading, and body weight in 1 month(s).

## 2015-02-28 NOTE — Patient Instructions (Signed)
Good job on the changes that you have made. Remember medications when scheduled. Be as active as you can.  Do something daily such as walking.  Relaxation exercises are important as well.   Continue to be mindful about portion sizes. When still hungry after a meal go back for the non starchy vegetables.

## 2015-04-15 ENCOUNTER — Encounter (HOSPITAL_COMMUNITY): Payer: Self-pay | Admitting: Emergency Medicine

## 2015-04-15 ENCOUNTER — Emergency Department (HOSPITAL_COMMUNITY)
Admission: EM | Admit: 2015-04-15 | Discharge: 2015-04-16 | Disposition: A | Discharge status: Home / Self Care | Payer: Medicaid Other | Attending: Emergency Medicine | Admitting: Emergency Medicine

## 2015-04-15 DIAGNOSIS — R531 Weakness: Secondary | ICD-10-CM | POA: Insufficient documentation

## 2015-04-15 DIAGNOSIS — M5440 Lumbago with sciatica, unspecified side: Secondary | ICD-10-CM | POA: Insufficient documentation

## 2015-04-15 DIAGNOSIS — Z79899 Other long term (current) drug therapy: Secondary | ICD-10-CM | POA: Insufficient documentation

## 2015-04-15 DIAGNOSIS — E119 Type 2 diabetes mellitus without complications: Secondary | ICD-10-CM | POA: Diagnosis not present

## 2015-04-15 DIAGNOSIS — Z794 Long term (current) use of insulin: Secondary | ICD-10-CM | POA: Diagnosis not present

## 2015-04-15 DIAGNOSIS — E785 Hyperlipidemia, unspecified: Secondary | ICD-10-CM | POA: Diagnosis not present

## 2015-04-15 DIAGNOSIS — I10 Essential (primary) hypertension: Secondary | ICD-10-CM | POA: Insufficient documentation

## 2015-04-15 DIAGNOSIS — M545 Low back pain: Secondary | ICD-10-CM | POA: Diagnosis present

## 2015-04-15 DIAGNOSIS — Z87828 Personal history of other (healed) physical injury and trauma: Secondary | ICD-10-CM | POA: Diagnosis not present

## 2015-04-15 DIAGNOSIS — Z791 Long term (current) use of non-steroidal anti-inflammatories (NSAID): Secondary | ICD-10-CM | POA: Insufficient documentation

## 2015-04-15 MED ORDER — METHOCARBAMOL 500 MG PO TABS: 500.0000 mg | ORAL_TABLET | Freq: Three times a day (TID) | ORAL | Status: DC | PRN

## 2015-04-15 MED ORDER — KETOROLAC TROMETHAMINE 15 MG/ML IJ SOLN
15.0000 mg | Freq: Once | INTRAMUSCULAR | Status: AC
  Administered 2015-04-15: 15 mg via INTRAMUSCULAR
  Filled 2015-04-15: qty 1

## 2015-04-15 MED ORDER — DIAZEPAM 5 MG/ML IJ SOLN
5.0000 mg | Freq: Once | INTRAMUSCULAR | Status: AC
  Administered 2015-04-15: 5 mg via INTRAMUSCULAR
  Filled 2015-04-15: qty 2

## 2015-04-15 MED ORDER — HYDROMORPHONE HCL 1 MG/ML IJ SOLN
1.0000 mg | Freq: Once | INTRAMUSCULAR | Status: AC
  Administered 2015-04-15: 1 mg via INTRAMUSCULAR
  Filled 2015-04-15: qty 1

## 2015-04-15 MED ORDER — DEXAMETHASONE 4 MG PO TABS
12.0000 mg | ORAL_TABLET | Freq: Once | ORAL | Status: AC
  Administered 2015-04-15: 12 mg via ORAL
  Filled 2015-04-15: qty 3

## 2015-04-15 NOTE — Discharge Instructions (Signed)
Back Pain, Adult Low back pain is very common. About 1 in 5 people have back pain.The cause of low back pain is rarely dangerous. The pain often gets better over time.About half of people with a sudden onset of back pain feel better in just 2 weeks. About 8 in 10 people feel better by 6 weeks.  CAUSES Some common causes of back pain include:  Strain of the muscles or ligaments supporting the spine.  Wear and tear (degeneration) of the spinal discs.  Arthritis.  Direct injury to the back. DIAGNOSIS Most of the time, the direct cause of low back pain is not known.However, back pain can be treated effectively even when the exact cause of the pain is unknown.Answering your caregiver's questions about your overall health and symptoms is one of the most accurate ways to make sure the cause of your pain is not dangerous. If your caregiver needs more information, he or she may order lab work or imaging tests (X-rays or MRIs).However, even if imaging tests show changes in your back, this usually does not require surgery. HOME CARE INSTRUCTIONS For many people, back pain returns.Since low back pain is rarely dangerous, it is often a condition that people can learn to manageon their own.   Remain active. It is stressful on the back to sit or stand in one place. Do not sit, drive, or stand in one place for more than 30 minutes at a time. Take short walks on level surfaces as soon as pain allows.Try to increase the length of time you walk each day.  Do not stay in bed.Resting more than 1 or 2 days can delay your recovery.  Do not avoid exercise or work.Your body is made to move.It is not dangerous to be active, even though your back may hurt.Your back will likely heal faster if you return to being active before your pain is gone.  Pay attention to your body when you bend and lift. Many people have less discomfortwhen lifting if they bend their knees, keep the load close to their bodies,and  avoid twisting. Often, the most comfortable positions are those that put less stress on your recovering back.  Find a comfortable position to sleep. Use a firm mattress and lie on your side with your knees slightly bent. If you lie on your back, put a pillow under your knees.  Only take over-the-counter or prescription medicines as directed by your caregiver. Over-the-counter medicines to reduce pain and inflammation are often the most helpful.Your caregiver may prescribe muscle relaxant drugs.These medicines help dull your pain so you can more quickly return to your normal activities and healthy exercise.  Put ice on the injured area.  Put ice in a plastic bag.  Place a towel between your skin and the bag.  Leave the ice on for 15-20 minutes, 03-04 times a day for the first 2 to 3 days. After that, ice and heat may be alternated to reduce pain and spasms.  Ask your caregiver about trying back exercises and gentle massage. This may be of some benefit.  Avoid feeling anxious or stressed.Stress increases muscle tension and can worsen back pain.It is important to recognize when you are anxious or stressed and learn ways to manage it.Exercise is a great option. SEEK MEDICAL CARE IF:  You have pain that is not relieved with rest or medicine.  You have pain that does not improve in 1 week.  You have new symptoms.  You are generally not feeling well. SEEK   IMMEDIATE MEDICAL CARE IF:   You have pain that radiates from your back into your legs.  You develop new bowel or bladder control problems.  You have unusual weakness or numbness in your arms or legs.  You develop nausea or vomiting.  You develop abdominal pain.  You feel faint. Document Released: 08/27/2005 Document Revised: 02/26/2012 Document Reviewed: 12/29/2013 Exit[employer] Patient Information 2015 [employer]. This information is not intended to replace advice given to you by your health care provider. Make sure you  discuss any questions you have with your health care provider.  

## 2015-04-15 NOTE — ED Provider Notes (Signed)
CSN: [id number]     Arrival date & time 04/15/15  2023 History   This chart was scribed for Virgel Manifold, MD by Vincent Mcintyre, ED Scribe. This patient was seen in room WA08/WA08 and the patient's care was started 11:08 PM.   Chief Complaint  Patient presents with  . Back Pain   The history is provided by the patient. No language interpreter was used.    HPI Comments: Vincent Mcintyre is a 49 y.o. male with a PMHx of back pain who presents to the Emergency Department complaining of constant, ongoing, worsening lower back pain that radiates into the L lower extremity to mid calf x 1 week. Currently he rates pain 8-9/10. Discomfort is worsened when leaning forward, ambulation, weight bearing, and when laying down. No alleviating factors at this time. Pt also reports generalized weakness to lower extremities resulting in 2-3 falls in the last few days. Vincent Mcintyre attributes symptoms to a previous back injury with damage to L4-L5 sustained 3 years ago. OTC Naproxen attempted prior to arrival without any improvement to symptoms. No recent fever or chills. He denies any bowel or urinary incontinence. Currently he is established with an orthopedist- [facility]; Next follow up 04/19/15. Pt was sent over to Emergency Department this evening from PCP recommending MRI. No known allergies to medications.   Past Medical History  Diagnosis Date  . Hypertension   . Diabetes mellitus      Dr. Leighton Ruff - [facility]  . Hyperlipidemia    Past Surgical History  Procedure Laterality Date  . Incision and drainage perirectal abscess  11/29/2014  . Incision and drainage perirectal abscess N/A 11/29/2014    Procedure: IRRIGATION AND DEBRIDEMENT PERIRECTAL ABSCESS;  Surgeon: Ralene Ok, MD;  Location: [facility];  Service: General;  Laterality: N/A;   No family history on file. History  Substance Use Topics  . Smoking status: Never Smoker   . Smokeless tobacco: Never Used  . Alcohol Use: No    Review  of Systems  Constitutional: Negative for fever and chills.  Respiratory: Negative for cough and shortness of breath.   Gastrointestinal: Negative for nausea, vomiting and abdominal pain.  Musculoskeletal: Positive for back pain.  Neurological: Positive for weakness. Negative for numbness.  Psychiatric/Behavioral: Negative for confusion.  All other systems reviewed and are negative.     Allergies  Review of patient's allergies indicates no known allergies.  Home Medications   Prior to Admission medications   Medication Sig Start Date End Date Taking? Authorizing Provider  HYDROcodone-acetaminophen (NORCO/VICODIN) 5-325 MG per tablet Take 1-2 tablets by mouth every 6 (six) hours as needed for moderate pain. 12/01/14  Yes Nat Christen, PA-C  insulin aspart (NOVOLOG) 100 UNIT/ML FlexPen Inject 5 Units into the skin 3 (three) times daily with meals. 12/01/14  Yes Bonnielee Haff, MD  Insulin Glargine (LANTUS) 100 UNIT/ML Solostar Pen Inject 25 Units into the skin daily with breakfast. Patient taking differently: Inject 30 Units into the skin daily with breakfast.  12/01/14  Yes Bonnielee Haff, MD  Insulin Pen Needle 29G X 5MM MISC Use with insulin pens as directed 12/01/14  Yes Bonnielee Haff, MD  lisinopril (PRINIVIL,ZESTRIL) 2.5 MG tablet Take 2.5 mg by mouth daily. 04/03/15  Yes Historical Provider, MD  metFORMIN (GLUCOPHAGE) 500 MG tablet Take 1,000 mg by mouth 2 (two) times daily with a meal.   Yes Historical Provider, MD  naproxen (NAPROSYN) 375 MG tablet Take 375 mg by mouth daily.    Yes  Historical Provider, MD  senna-docusate (SENOKOT-S) 8.6-50 MG per tablet Take 1 tablet by mouth 2 (two) times daily. Patient taking differently: Take 1 tablet by mouth 2 (two) times daily as needed for mild constipation or moderate constipation.  12/01/14  Yes Nat Christen, PA-C  simvastatin (ZOCOR) 40 MG tablet Take 40 mg by mouth every morning. 10/17/14  Yes Historical Provider, MD   amoxicillin-clavulanate (AUGMENTIN) 875-125 MG per tablet Take 1 tablet by mouth 2 (two) times daily. Patient not taking: Reported on 12/07/2014 12/01/14   Nat Christen, PA-C  magnesium sulfate (EPSOM SALTS) crystals Apply topically daily. Patient not taking: Reported on 04/15/2015 12/01/14   Nat Christen, PA-C  polyethylene glycol powder (MIRALAX) powder Take 8.5-34 g by mouth 2 (two) times daily. To correct constipation.  Adjust dose over 1-2 months.  Goal = ~1 bowel movement / day Patient not taking: Reported on 04/15/2015 12/01/14   Nat Christen, PA-C   Triage Vitals: BP 151/92 mmHg  Pulse 96  Temp(Src) 98.7 F (37.1 C) (Oral)  Resp 20  SpO2 99%   Physical Exam  Constitutional: He is oriented to person, place, and time. He appears well-developed and well-nourished.  HENT:  Head: Normocephalic and atraumatic.  Eyes: EOM are normal.  Neck: Normal range of motion.  Cardiovascular: Normal rate, regular rhythm, normal heart sounds and intact distal pulses.   Pulmonary/Chest: Effort normal and breath sounds normal. No respiratory distress.  Abdominal: Soft. He exhibits no distension. There is no tenderness.  Musculoskeletal: Normal range of motion.  Grossly normal in appearance to R leg Strength 5/5 to bilateral lower extremities  Decreased sensation to light touch  Palpable DP pulse Postive straight leg test on the R  Neurological: He is alert and oriented to person, place, and time.  Skin: Skin is warm and dry.  Psychiatric: He has a normal mood and affect. Judgment normal.  Nursing note and vitals reviewed.   ED Course  Procedures (including critical care time)  DIAGNOSTIC STUDIES: Oxygen Saturation is 99% on RA, Normal by my interpretation.    COORDINATION OF CARE: 11:16 PM- Will give Dilaudid, Toradol, Valium, and Decadron. Discussed treatment plan with pt at bedside and pt agreed to plan.     Labs Review Labs Reviewed - No data to display  Imaging Review No results  found.   EKG Interpretation None      MDM   Final diagnoses:  Right low back pain, with sciatica presence unspecified    49 year old male with increasing lower back pain. Has had some falls recently. Not completely clear whether this is secondary to pain is appears to weakness. Objectively, his strength seems normal on my examination. Perhaps some mild decreased sensation to light touch for hemostasis old right lower extremity from proximal to the inguinal crease distally. He is vascularly intact. No bladder or bowel incontinence or retention. He may in obtaining an MRI, but no clear indication of this done emergently. Will treat his symptoms. Given the progression of his symptoms now some questionable neurologic deficits recommending neurosurgical follow-up. Return precautions were discussed.  I personally preformed the services scribed in my presence. The recorded information has been reviewed is accurate. Virgel Manifold, MD.   Virgel Manifold, MD 04/24/15 [phone]

## 2015-04-15 NOTE — ED Notes (Addendum)
Pt sent here from [place]   worsening back pain, weakness and increased falls over the past several days.  Pt reports PCP recommends MRI.Paper from MD office does not mention MRI.Pt denies loss of bowel or bladder control. Pt alert and oriented. Per patient his PCP reports decreased muscle tone and decreased sensory.

## 2015-05-02 ENCOUNTER — Ambulatory Visit: Payer: Medicaid Other | Admitting: Dietician

## 2015-05-09 ENCOUNTER — Encounter: Payer: Medicaid Other | Attending: Family Medicine | Admitting: Dietician

## 2015-05-09 ENCOUNTER — Encounter: Payer: Self-pay | Admitting: Dietician

## 2015-05-09 DIAGNOSIS — E1165 Type 2 diabetes mellitus with hyperglycemia: Secondary | ICD-10-CM | POA: Insufficient documentation

## 2015-05-09 DIAGNOSIS — Z713 Dietary counseling and surveillance: Secondary | ICD-10-CM | POA: Insufficient documentation

## 2015-05-09 DIAGNOSIS — Z6839 Body mass index (BMI) 39.0-39.9, adult: Secondary | ICD-10-CM | POA: Diagnosis not present

## 2015-05-09 DIAGNOSIS — K611 Rectal abscess: Secondary | ICD-10-CM | POA: Diagnosis not present

## 2015-05-09 DIAGNOSIS — Z794 Long term (current) use of insulin: Secondary | ICD-10-CM | POA: Diagnosis not present

## 2015-05-09 DIAGNOSIS — IMO0002 Reserved for concepts with insufficient information to code with codable children: Secondary | ICD-10-CM

## 2015-05-09 NOTE — Progress Notes (Signed)
Medical Nutrition Therapy:  Appt start time: 1030 end time:  1100.  Assessment:  12/27/14 Primary concerns today: Patient is here today with his wife.  Patient would like to learn how to better manage his diabetes.  States that he stays on track with DM control for a while and sways off due to finances, stress, frustration, illness, anger. Patient checks his blood sugar up to 6 x per day (before and after meals)   Blood sugar has been about 130-160 overall since d/c from hospital about 2 weeks ago.  Patient has type 2 DM for the past 4 years.  Hx also includes surgery for rectal abscess with surgery 2 weeks ago.  UBW:  279 lbs decreased to 267 during hospitalization and 273 lbs today.  HgbA1C per wife was 11.3% 11/29/14, 11.8% 04/26/14, 8.6% 02/19/13.  Mother has type 2 DM as well as brother who is close to needing dialysis.     Patient lives with wife and son.  Son has hemophilia.  Wife and patient share shopping and cooking.  They are active evenings with sons soccer practice.  Patient is unemployed for the past 3 years due to a back injury.  He takes steroid injections at times.    02/28/15: Patient is here alone today.  He is in significant back pain.  Reports that he is unable to exercise.  Since last visit, he has had increased stress.  (His wife had knee surgery and was hospitalized with pneumonia and his son had strep throat.)  He has also developed another abscess this time on his tailbone.  Due to increased stress, he forgot to take his medications (primarily evening) at times.  We discussed placement of meds and tips to improve this.  Patient states that he is doing better at this time and does not desire to change this.  He is now concerned about fasting blood sugar which has increased from 120 to high 100's and low 200's.  States that his last HgbA1C was 8.3% at the beginning of this month.  This has improved.  Weight 272 lbs which is stable.  Patient has become more mindful about portion sizes  and food choices.  Money is limited.  He has been using the EBT card at the [employer] which will give him a voucher for more food.  He is learning how to prepare and eat different vegetables.  Son gets a bag of food at school each week as well.  05/09/15: Patient is her today with his wife.  He remains in severe back pain which he states has worsened overall and that nothing has helped including recent nerve block.  He reports that his last HgbA1C was 11.4% recently.  He continues to often forget his medications including insulin until the evening.  His weight is 267 lbs today decreased from 272 lbs 6/20.  He states that this is due to not eating.  He will skip meals occasionally and states that it hurts too bad to get up to get a snack.  He states that his blood sugar is often in the low 200's before breakfast and that CBG's seem to be increasing.  Increased to about 500 on son's birthday recently due to 2 regular soda and cake.  Preferred Learning Style:   No preference indicated   Learning Readiness:   Ready  Change in progress   MEDICATIONS: see list to include novolog, lantus, and glucophage   DIETARY INTAKE: States that he does not like fruit  much.  Will eat watermelon, apple, pear or 1/2 banana Occasional sweets with Sunday dinner.    24-hr recall: from April visit. B (8:30 AM): oatmeal Snk ( AM): none  L (11-1 PM): leftovers or chicken salad on [employer] bread and soup or chef salad or [place] sandwich with handful potato chips or side salad Snk ( PM): trail mix (almonds, raisins, craisins, chocolate chips) D (4:30-5 PM): pulled pork, rice, beans, tomatoes Snk ( PM): tries to avoid eating after 8:00.  If he eats it will be popcorn. Beverages: 20 oz milk, mio water, OJ, 2% milk  Usual physical activity: back injury.  Walking is very limited.    Estimated energy needs: 1600-1800 calories 180 g carbohydrates 120 g protein 44 g fat  Progress Towards Goal(s):  In  progress.   Nutritional Diagnosis:  NB-1.1 Food and nutrition-related knowledge deficit As related to balance of carbohydrate, protein, and fat.  As evidenced by diet hx.    Intervention:  Nutrition counseling and diabetes education continued. Will send patient an e-mail with further tips to remember his insulin and other medications.  Remember your medication when scheduled.  Leave notes.  Do whatever it takes to remember. Continue to be more mindful about your portion size. Eat regularly scheduled meals.  Teaching Method Utilized:  Visual Auditory  Barriers to learning/adherence to lifestyle change: increased pain  Demonstrated degree of understanding via:  Teach Back   Monitoring/Evaluation:  Dietary intake, exercise, label reading, and body weight prn.  Wife to e-mail me with any questions.

## 2015-05-09 NOTE — Patient Instructions (Signed)
Remember your medication when scheduled.  Leave notes.  Do whatever it takes to remember. Continue to be more mindful about your portion size. Eat regularly scheduled meals.

## 2015-05-25 ENCOUNTER — Other Ambulatory Visit: Payer: Self-pay | Admitting: Neurological Surgery

## 2015-05-25 DIAGNOSIS — M5416 Radiculopathy, lumbar region: Secondary | ICD-10-CM

## 2015-05-26 ENCOUNTER — Telehealth: Payer: Self-pay

## 2015-05-26 NOTE — Telephone Encounter (Signed)
Pt stated that he has an appt with Valinda Hoar not here in this office.

## 2015-06-07 ENCOUNTER — Inpatient Hospital Stay
Admission: RE | Admit: 2015-06-07 | Discharge: 2015-06-07 | Disposition: A | Discharge status: Home / Self Care | Payer: Medicaid Other | Source: Ambulatory Visit | Attending: Neurological Surgery | Admitting: Neurological Surgery

## 2015-06-07 ENCOUNTER — Other Ambulatory Visit: Payer: Medicaid Other

## 2015-06-07 NOTE — Discharge Instructions (Signed)

## 2015-06-15 ENCOUNTER — Other Ambulatory Visit: Payer: Medicaid Other

## 2016-03-28 IMAGING — MR MR LUMBAR SPINE W/O CM
5 series · 33 of 48 positions shown · non-contrast
Comparison: MRI lumbar spine 07/30/2013

CLINICAL DATA: Central low back pain. Pain extends into the right
lower extremity with numbness. Symptoms began after hitting a large
bump while driving a truck.

EXAM:
MRI LUMBAR SPINE WITHOUT CONTRAST
TECHNIQUE: Multiplanar, multisequence MR imaging of the lumbar spine was
performed. No intravenous contrast was administered.

[Series 3: T2 · sagittal · 4.0mm · 0.49mm/px · 5 of 12 slices shown (1 of 2)]
[im 1/12]
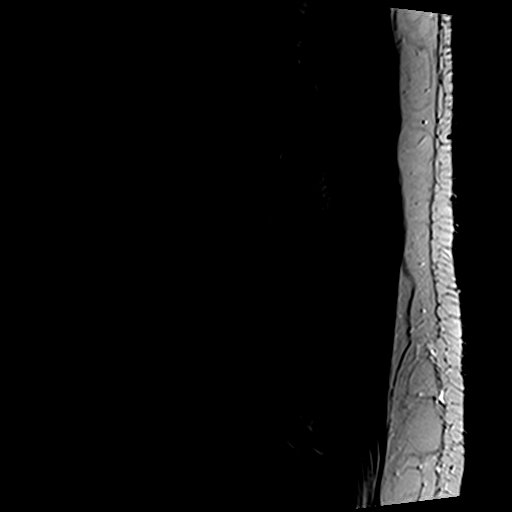
[im 3/12]
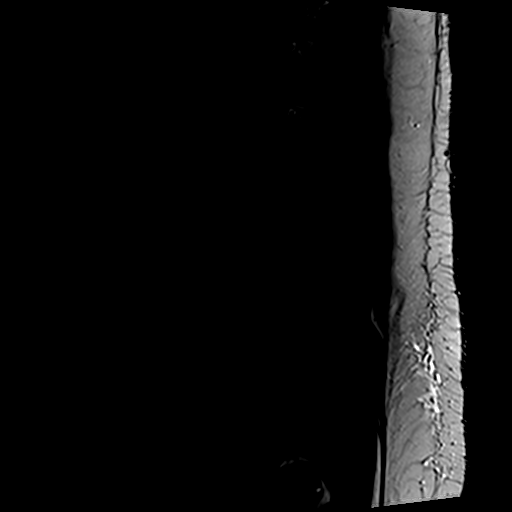
[im 6/12]
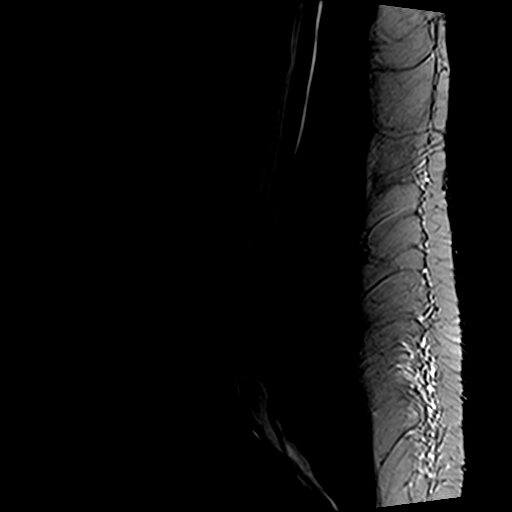
[im 9/12]
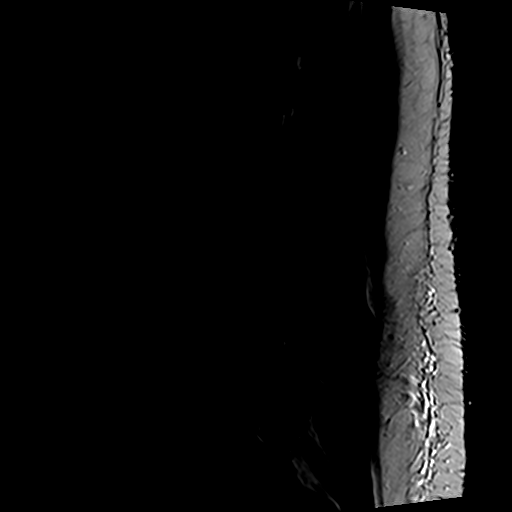
[im 12/12]
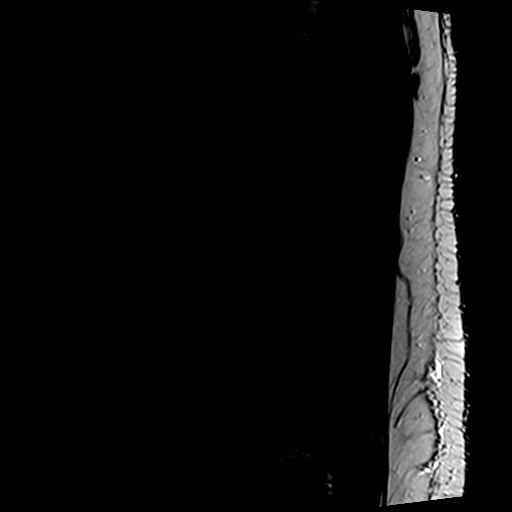

[Series 4: T1 · sagittal · 4.0mm · 0.49mm/px · 6 of 12 slices shown (1 of 2)]
[im 1/12]
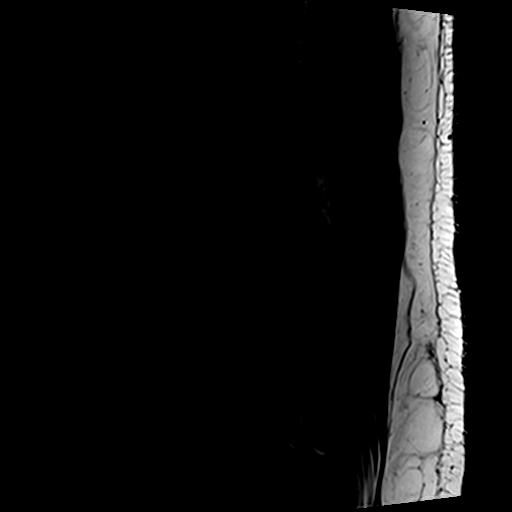
[im 3/12]
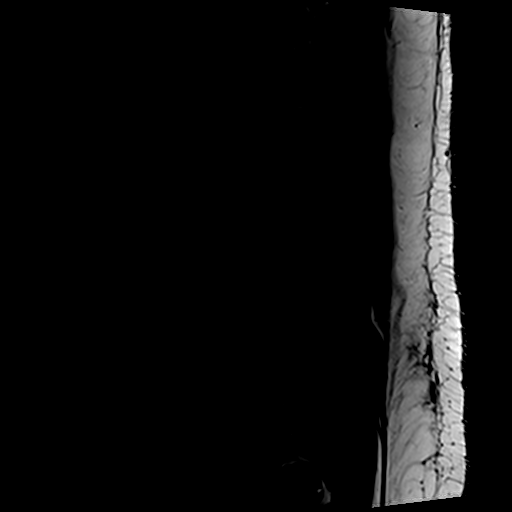
[im 5/12]
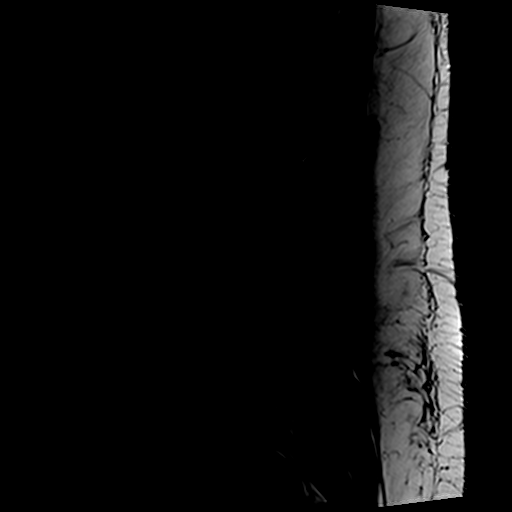
[im 7/12]
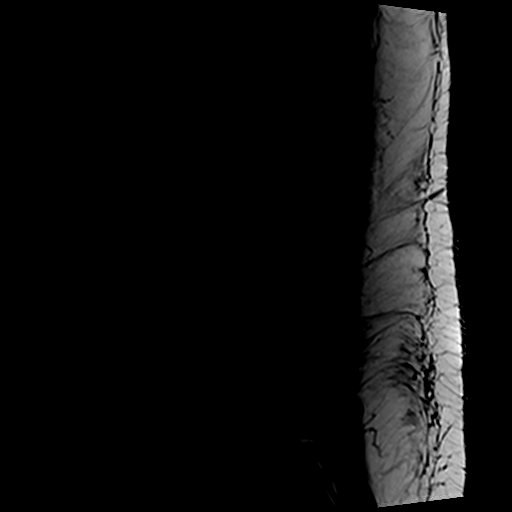
[im 9/12]
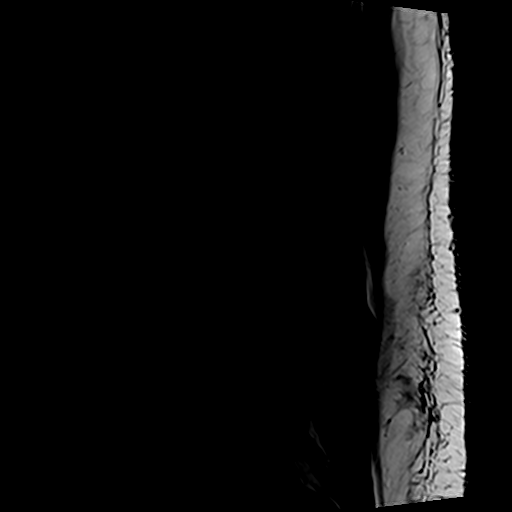
[im 12/12]
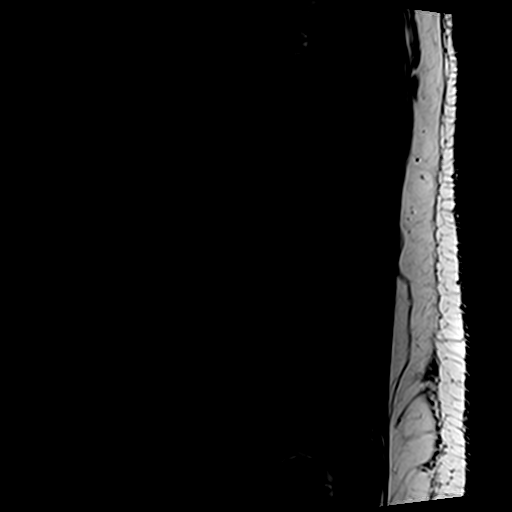

[Series 5: STIR · sagittal · 4.0mm · 0.49mm/px · 3 of 12 slices shown]
[im 1/12]
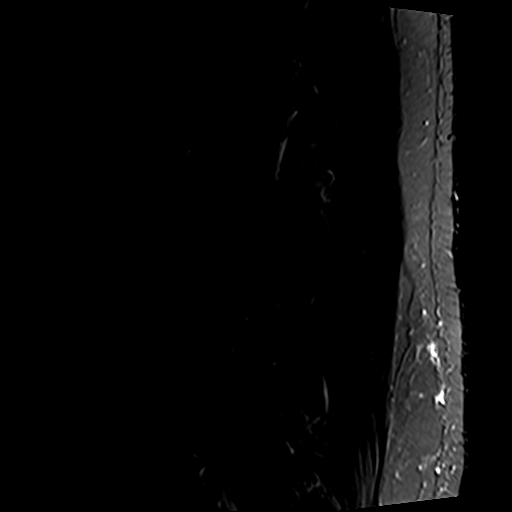
[im 3/12]
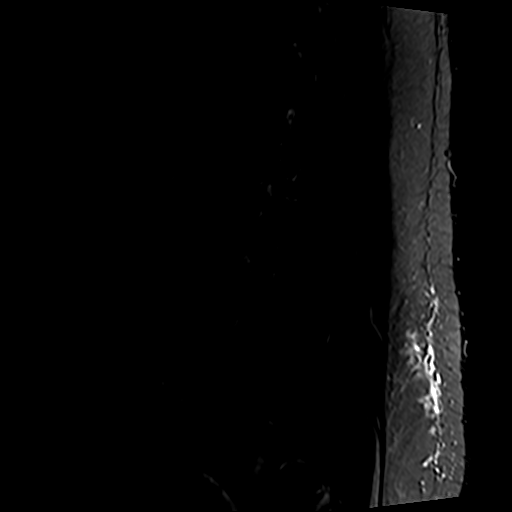
[im 5/12]
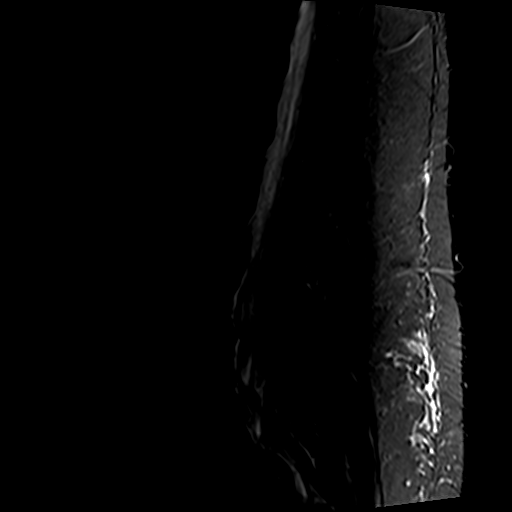

[Series 6: T2 · axial · 4.0mm · 0.74mm/px · z∈[-184,+1]mm · 9 of 33 slices shown (2 of 2)]
[im 1/33]
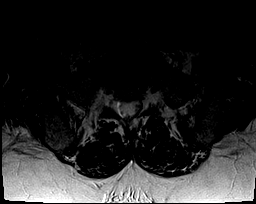
[im 5/33]
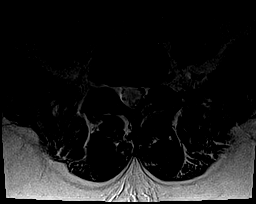
[im 10/33]
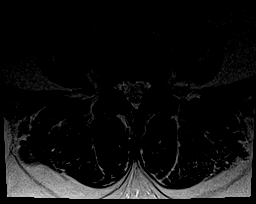
[im 14/33]
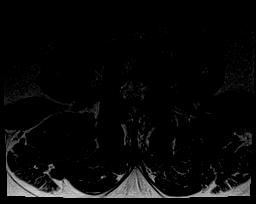
[im 17/33]
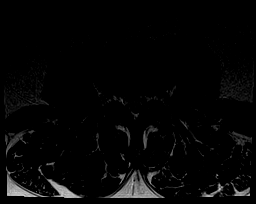
[im 19/33]
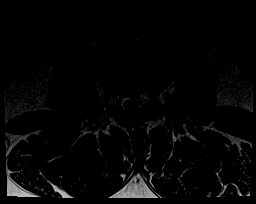
[im 23/33]
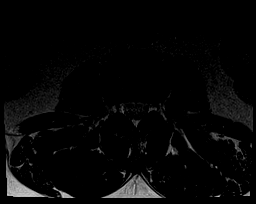
[im 28/33]
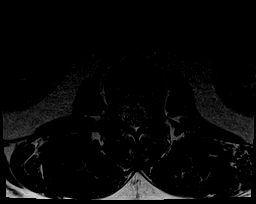
[im 33/33]
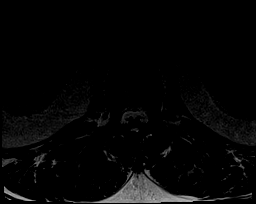

[Series 7: T1 · axial · 4.0mm · 0.74mm/px · z∈[-174,+1]mm · 10 of 34 slices shown (2 of 2)]
[im 3/34]
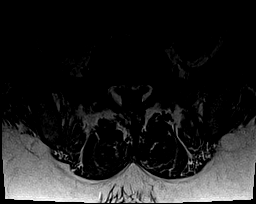
[im 5/34]
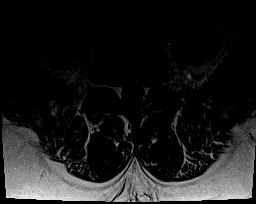
[im 7/34]
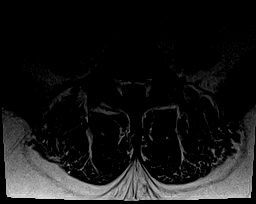
[im 12/34]
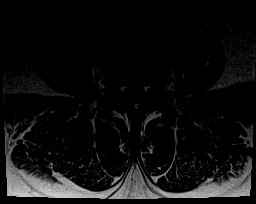
[im 16/34]
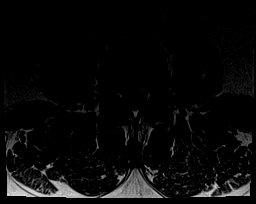
[im 18/34]
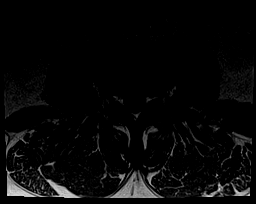
[im 20/34]
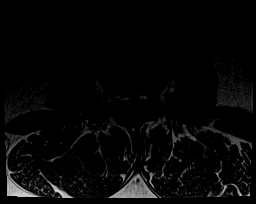
[im 25/34]
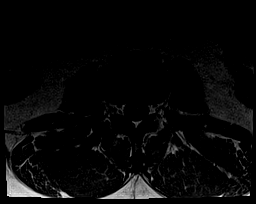
[im 29/34]
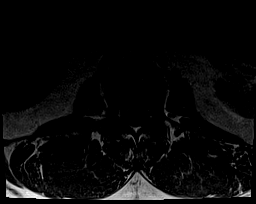
[im 34/34]
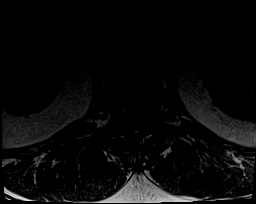

[33 of 48 positions shown; findings below may reference images not displayed]

FINDINGS: Normal signal is present in the conus medullaris which terminates at
L1-2, within normal limits. Schmorl's nodes are present from T12-L1
through L3-4. Vertebral body heights are normal. Prominent
hemangioma is are evident at L1 and L4. Endplate marrow changes are
more prominent on the right at L5-S1. Leftward curvature of the
lumbar spine is centered at L4.

Limited imaging of the abdomen is unremarkable. There is no
significant adenopathy.

L1-2:  Negative.

L2-3: Negative.

L3-4: Rightward disc bulging is present without significant stenosis
or change. There is slight narrowing of the left foramen as before.

L4-5: A broad-based disc protrusion is present. Moderate facet
hypertrophy is similar to the prior exam. Mild foraminal stenosis is
similar to the prior exam.

L5-S1: A leftward disc protrusion is similar to the prior study.
Central canal is patent. Moderate left foraminal stenosis is
evident. Mild facet hypertrophy contributes.
IMPRESSION: 1. Stable multilevel spondylosis of the lumbar spine.
2. Mild leftward curvature of the lumbar spine is centered at L4.
3. Slight narrowing of the left foramen at L3-4 is stable.
4. Mild bilateral foraminal stenosis at L4-5 is stable. This is
secondary to a broad-based disc protrusion and moderate facet
hypertrophy.
5. Moderate left foraminal stenosis at L5-S1 secondary to a far left
lateral disc protrusion and mild facet hypertrophy.

## 2016-04-16 DIAGNOSIS — W57XXXA Bitten or stung by nonvenomous insect and other nonvenomous arthropods, initial encounter: Secondary | ICD-10-CM | POA: Diagnosis not present

## 2016-04-16 DIAGNOSIS — T63441A Toxic effect of venom of bees, accidental (unintentional), initial encounter: Secondary | ICD-10-CM | POA: Diagnosis not present

## 2016-05-12 DIAGNOSIS — J0100 Acute maxillary sinusitis, unspecified: Secondary | ICD-10-CM | POA: Diagnosis not present

## 2016-06-25 DIAGNOSIS — E782 Mixed hyperlipidemia: Secondary | ICD-10-CM | POA: Diagnosis not present

## 2016-06-25 DIAGNOSIS — E1165 Type 2 diabetes mellitus with hyperglycemia: Secondary | ICD-10-CM | POA: Diagnosis not present

## 2016-06-25 DIAGNOSIS — Z794 Long term (current) use of insulin: Secondary | ICD-10-CM | POA: Diagnosis not present

## 2016-06-25 DIAGNOSIS — Z6838 Body mass index (BMI) 38.0-38.9, adult: Secondary | ICD-10-CM | POA: Diagnosis not present

## 2016-06-25 DIAGNOSIS — Z23 Encounter for immunization: Secondary | ICD-10-CM | POA: Diagnosis not present

## 2016-07-13 DIAGNOSIS — G894 Chronic pain syndrome: Secondary | ICD-10-CM | POA: Diagnosis not present

## 2016-07-13 DIAGNOSIS — M5416 Radiculopathy, lumbar region: Secondary | ICD-10-CM | POA: Diagnosis not present

## 2016-07-28 DIAGNOSIS — G8929 Other chronic pain: Secondary | ICD-10-CM | POA: Diagnosis not present

## 2016-07-28 DIAGNOSIS — M5441 Lumbago with sciatica, right side: Secondary | ICD-10-CM | POA: Diagnosis not present

## 2016-08-07 DIAGNOSIS — H3589 Other specified retinal disorders: Secondary | ICD-10-CM | POA: Diagnosis not present

## 2016-08-07 DIAGNOSIS — H40003 Preglaucoma, unspecified, bilateral: Secondary | ICD-10-CM | POA: Diagnosis not present

## 2016-08-07 DIAGNOSIS — H2513 Age-related nuclear cataract, bilateral: Secondary | ICD-10-CM | POA: Diagnosis not present

## 2016-08-07 DIAGNOSIS — Z7984 Long term (current) use of oral hypoglycemic drugs: Secondary | ICD-10-CM | POA: Diagnosis not present

## 2016-08-07 DIAGNOSIS — H40053 Ocular hypertension, bilateral: Secondary | ICD-10-CM | POA: Diagnosis not present

## 2016-08-07 DIAGNOSIS — H40023 Open angle with borderline findings, high risk, bilateral: Secondary | ICD-10-CM | POA: Diagnosis not present

## 2016-08-07 DIAGNOSIS — H25013 Cortical age-related cataract, bilateral: Secondary | ICD-10-CM | POA: Diagnosis not present

## 2016-08-07 DIAGNOSIS — E119 Type 2 diabetes mellitus without complications: Secondary | ICD-10-CM | POA: Diagnosis not present

## 2016-08-17 DIAGNOSIS — G8929 Other chronic pain: Secondary | ICD-10-CM | POA: Diagnosis not present

## 2016-08-17 DIAGNOSIS — M5441 Lumbago with sciatica, right side: Secondary | ICD-10-CM | POA: Diagnosis not present

## 2016-08-17 DIAGNOSIS — G894 Chronic pain syndrome: Secondary | ICD-10-CM | POA: Diagnosis not present

## 2016-08-31 ENCOUNTER — Ambulatory Visit: Payer: Self-pay | Admitting: Physician Assistant

## 2016-09-06 DIAGNOSIS — M47816 Spondylosis without myelopathy or radiculopathy, lumbar region: Secondary | ICD-10-CM | POA: Diagnosis not present

## 2016-09-06 DIAGNOSIS — I10 Essential (primary) hypertension: Secondary | ICD-10-CM | POA: Diagnosis not present

## 2016-09-06 DIAGNOSIS — Z7984 Long term (current) use of oral hypoglycemic drugs: Secondary | ICD-10-CM | POA: Diagnosis not present

## 2016-09-06 DIAGNOSIS — E1165 Type 2 diabetes mellitus with hyperglycemia: Secondary | ICD-10-CM | POA: Diagnosis not present

## 2016-09-06 DIAGNOSIS — E782 Mixed hyperlipidemia: Secondary | ICD-10-CM | POA: Diagnosis not present

## 2016-09-06 DIAGNOSIS — M545 Low back pain: Secondary | ICD-10-CM | POA: Diagnosis not present

## 2016-09-06 DIAGNOSIS — Z01810 Encounter for preprocedural cardiovascular examination: Secondary | ICD-10-CM | POA: Diagnosis not present

## 2016-09-06 DIAGNOSIS — M9983 Other biomechanical lesions of lumbar region: Secondary | ICD-10-CM | POA: Diagnosis not present

## 2016-09-06 DIAGNOSIS — Z794 Long term (current) use of insulin: Secondary | ICD-10-CM | POA: Diagnosis not present

## 2016-09-12 DIAGNOSIS — E1165 Type 2 diabetes mellitus with hyperglycemia: Secondary | ICD-10-CM | POA: Diagnosis not present

## 2016-09-12 DIAGNOSIS — E782 Mixed hyperlipidemia: Secondary | ICD-10-CM | POA: Diagnosis not present

## 2016-09-12 DIAGNOSIS — Z794 Long term (current) use of insulin: Secondary | ICD-10-CM | POA: Diagnosis not present

## 2016-09-12 DIAGNOSIS — Z6839 Body mass index (BMI) 39.0-39.9, adult: Secondary | ICD-10-CM | POA: Diagnosis not present

## 2016-09-20 DIAGNOSIS — Z794 Long term (current) use of insulin: Secondary | ICD-10-CM | POA: Diagnosis not present

## 2016-09-20 DIAGNOSIS — Z6838 Body mass index (BMI) 38.0-38.9, adult: Secondary | ICD-10-CM | POA: Diagnosis not present

## 2016-09-20 DIAGNOSIS — E1165 Type 2 diabetes mellitus with hyperglycemia: Secondary | ICD-10-CM | POA: Diagnosis not present

## 2016-09-20 DIAGNOSIS — E782 Mixed hyperlipidemia: Secondary | ICD-10-CM | POA: Diagnosis not present

## 2016-09-24 ENCOUNTER — Inpatient Hospital Stay (HOSPITAL_COMMUNITY): Admission: RE | Admit: 2016-09-24 | Payer: Medicaid Other | Source: Ambulatory Visit

## 2016-09-26 ENCOUNTER — Ambulatory Visit: Admit: 2016-09-26 | Payer: Medicaid Other | Admitting: Orthopedic Surgery

## 2016-09-26 SURGERY — INSERTION, SPINAL CORD STIMULATOR, LUMBAR
Anesthesia: General

## 2016-10-10 DIAGNOSIS — Z6839 Body mass index (BMI) 39.0-39.9, adult: Secondary | ICD-10-CM | POA: Diagnosis not present

## 2016-10-10 DIAGNOSIS — E1165 Type 2 diabetes mellitus with hyperglycemia: Secondary | ICD-10-CM | POA: Diagnosis not present

## 2016-10-10 DIAGNOSIS — E782 Mixed hyperlipidemia: Secondary | ICD-10-CM | POA: Diagnosis not present

## 2016-10-10 DIAGNOSIS — Z794 Long term (current) use of insulin: Secondary | ICD-10-CM | POA: Diagnosis not present

## 2016-10-19 DIAGNOSIS — G894 Chronic pain syndrome: Secondary | ICD-10-CM | POA: Diagnosis not present

## 2016-10-19 DIAGNOSIS — G8929 Other chronic pain: Secondary | ICD-10-CM | POA: Diagnosis not present

## 2016-10-19 DIAGNOSIS — Z79891 Long term (current) use of opiate analgesic: Secondary | ICD-10-CM | POA: Diagnosis not present

## 2016-10-19 DIAGNOSIS — M5441 Lumbago with sciatica, right side: Secondary | ICD-10-CM | POA: Diagnosis not present

## 2016-10-23 DIAGNOSIS — Z6838 Body mass index (BMI) 38.0-38.9, adult: Secondary | ICD-10-CM | POA: Diagnosis not present

## 2016-10-23 DIAGNOSIS — Z794 Long term (current) use of insulin: Secondary | ICD-10-CM | POA: Diagnosis not present

## 2016-10-23 DIAGNOSIS — E782 Mixed hyperlipidemia: Secondary | ICD-10-CM | POA: Diagnosis not present

## 2016-10-23 DIAGNOSIS — E1165 Type 2 diabetes mellitus with hyperglycemia: Secondary | ICD-10-CM | POA: Diagnosis not present

## 2016-10-24 DIAGNOSIS — Z794 Long term (current) use of insulin: Secondary | ICD-10-CM | POA: Diagnosis not present

## 2016-10-24 DIAGNOSIS — E1165 Type 2 diabetes mellitus with hyperglycemia: Secondary | ICD-10-CM | POA: Diagnosis not present

## 2016-11-06 DIAGNOSIS — E782 Mixed hyperlipidemia: Secondary | ICD-10-CM | POA: Diagnosis not present

## 2016-11-06 DIAGNOSIS — Z6841 Body Mass Index (BMI) 40.0 and over, adult: Secondary | ICD-10-CM | POA: Diagnosis not present

## 2016-11-06 DIAGNOSIS — E119 Type 2 diabetes mellitus without complications: Secondary | ICD-10-CM | POA: Diagnosis not present

## 2016-11-06 DIAGNOSIS — Z794 Long term (current) use of insulin: Secondary | ICD-10-CM | POA: Diagnosis not present

## 2016-11-21 DIAGNOSIS — E119 Type 2 diabetes mellitus without complications: Secondary | ICD-10-CM | POA: Diagnosis not present

## 2016-11-21 DIAGNOSIS — Z6838 Body mass index (BMI) 38.0-38.9, adult: Secondary | ICD-10-CM | POA: Diagnosis not present

## 2016-11-21 DIAGNOSIS — Z794 Long term (current) use of insulin: Secondary | ICD-10-CM | POA: Diagnosis not present

## 2016-11-21 DIAGNOSIS — E782 Mixed hyperlipidemia: Secondary | ICD-10-CM | POA: Diagnosis not present

## 2016-12-18 DIAGNOSIS — Z6841 Body Mass Index (BMI) 40.0 and over, adult: Secondary | ICD-10-CM | POA: Diagnosis not present

## 2016-12-18 DIAGNOSIS — E119 Type 2 diabetes mellitus without complications: Secondary | ICD-10-CM | POA: Diagnosis not present

## 2016-12-18 DIAGNOSIS — E782 Mixed hyperlipidemia: Secondary | ICD-10-CM | POA: Diagnosis not present

## 2016-12-18 DIAGNOSIS — Z794 Long term (current) use of insulin: Secondary | ICD-10-CM | POA: Diagnosis not present

## 2016-12-26 DIAGNOSIS — Z794 Long term (current) use of insulin: Secondary | ICD-10-CM | POA: Diagnosis not present

## 2016-12-26 DIAGNOSIS — E1165 Type 2 diabetes mellitus with hyperglycemia: Secondary | ICD-10-CM | POA: Diagnosis not present

## 2016-12-26 DIAGNOSIS — Z7984 Long term (current) use of oral hypoglycemic drugs: Secondary | ICD-10-CM | POA: Diagnosis not present

## 2017-01-17 DIAGNOSIS — Z79891 Long term (current) use of opiate analgesic: Secondary | ICD-10-CM | POA: Diagnosis not present

## 2017-01-17 DIAGNOSIS — E782 Mixed hyperlipidemia: Secondary | ICD-10-CM | POA: Diagnosis not present

## 2017-01-17 DIAGNOSIS — E119 Type 2 diabetes mellitus without complications: Secondary | ICD-10-CM | POA: Diagnosis not present

## 2017-01-17 DIAGNOSIS — Z794 Long term (current) use of insulin: Secondary | ICD-10-CM | POA: Diagnosis not present

## 2017-01-17 DIAGNOSIS — M5441 Lumbago with sciatica, right side: Secondary | ICD-10-CM | POA: Diagnosis not present

## 2017-01-17 DIAGNOSIS — G894 Chronic pain syndrome: Secondary | ICD-10-CM | POA: Diagnosis not present

## 2017-01-17 DIAGNOSIS — G8929 Other chronic pain: Secondary | ICD-10-CM | POA: Diagnosis not present

## 2017-02-12 DIAGNOSIS — H18413 Arcus senilis, bilateral: Secondary | ICD-10-CM | POA: Diagnosis not present

## 2017-02-12 DIAGNOSIS — H5203 Hypermetropia, bilateral: Secondary | ICD-10-CM | POA: Diagnosis not present

## 2017-02-12 DIAGNOSIS — H2513 Age-related nuclear cataract, bilateral: Secondary | ICD-10-CM | POA: Diagnosis not present

## 2017-02-12 DIAGNOSIS — H524 Presbyopia: Secondary | ICD-10-CM | POA: Diagnosis not present

## 2017-02-12 DIAGNOSIS — H11423 Conjunctival edema, bilateral: Secondary | ICD-10-CM | POA: Diagnosis not present

## 2017-02-12 DIAGNOSIS — H3589 Other specified retinal disorders: Secondary | ICD-10-CM | POA: Diagnosis not present

## 2017-02-12 DIAGNOSIS — H40053 Ocular hypertension, bilateral: Secondary | ICD-10-CM | POA: Diagnosis not present

## 2017-02-12 DIAGNOSIS — H52223 Regular astigmatism, bilateral: Secondary | ICD-10-CM | POA: Diagnosis not present

## 2017-02-12 DIAGNOSIS — H04123 Dry eye syndrome of bilateral lacrimal glands: Secondary | ICD-10-CM | POA: Diagnosis not present

## 2017-02-12 DIAGNOSIS — H40023 Open angle with borderline findings, high risk, bilateral: Secondary | ICD-10-CM | POA: Diagnosis not present

## 2017-02-12 DIAGNOSIS — H11153 Pinguecula, bilateral: Secondary | ICD-10-CM | POA: Diagnosis not present

## 2017-03-06 DIAGNOSIS — H18413 Arcus senilis, bilateral: Secondary | ICD-10-CM | POA: Diagnosis not present

## 2017-03-06 DIAGNOSIS — H3589 Other specified retinal disorders: Secondary | ICD-10-CM | POA: Diagnosis not present

## 2017-03-06 DIAGNOSIS — Z794 Long term (current) use of insulin: Secondary | ICD-10-CM | POA: Diagnosis not present

## 2017-03-06 DIAGNOSIS — Z7984 Long term (current) use of oral hypoglycemic drugs: Secondary | ICD-10-CM | POA: Diagnosis not present

## 2017-03-06 DIAGNOSIS — E119 Type 2 diabetes mellitus without complications: Secondary | ICD-10-CM | POA: Diagnosis not present

## 2017-03-06 DIAGNOSIS — H25013 Cortical age-related cataract, bilateral: Secondary | ICD-10-CM | POA: Diagnosis not present

## 2017-03-06 DIAGNOSIS — H40023 Open angle with borderline findings, high risk, bilateral: Secondary | ICD-10-CM | POA: Diagnosis not present

## 2017-03-06 DIAGNOSIS — H11153 Pinguecula, bilateral: Secondary | ICD-10-CM | POA: Diagnosis not present

## 2017-03-06 DIAGNOSIS — H11423 Conjunctival edema, bilateral: Secondary | ICD-10-CM | POA: Diagnosis not present

## 2017-04-30 DIAGNOSIS — G894 Chronic pain syndrome: Secondary | ICD-10-CM | POA: Diagnosis not present

## 2017-04-30 DIAGNOSIS — M5441 Lumbago with sciatica, right side: Secondary | ICD-10-CM | POA: Diagnosis not present

## 2017-04-30 DIAGNOSIS — Z79891 Long term (current) use of opiate analgesic: Secondary | ICD-10-CM | POA: Diagnosis not present

## 2017-04-30 DIAGNOSIS — M50322 Other cervical disc degeneration at C5-C6 level: Secondary | ICD-10-CM | POA: Diagnosis not present

## 2017-07-04 DIAGNOSIS — Z794 Long term (current) use of insulin: Secondary | ICD-10-CM | POA: Diagnosis not present

## 2017-07-04 DIAGNOSIS — Z79899 Other long term (current) drug therapy: Secondary | ICD-10-CM | POA: Diagnosis not present

## 2017-07-04 DIAGNOSIS — Z5181 Encounter for therapeutic drug level monitoring: Secondary | ICD-10-CM | POA: Diagnosis not present

## 2017-07-04 DIAGNOSIS — E782 Mixed hyperlipidemia: Secondary | ICD-10-CM | POA: Diagnosis not present

## 2017-07-04 DIAGNOSIS — E1165 Type 2 diabetes mellitus with hyperglycemia: Secondary | ICD-10-CM | POA: Diagnosis not present

## 2017-07-04 DIAGNOSIS — Z23 Encounter for immunization: Secondary | ICD-10-CM | POA: Diagnosis not present

## 2017-09-19 DIAGNOSIS — E119 Type 2 diabetes mellitus without complications: Secondary | ICD-10-CM | POA: Diagnosis not present

## 2017-09-19 DIAGNOSIS — H3589 Other specified retinal disorders: Secondary | ICD-10-CM | POA: Diagnosis not present

## 2017-09-19 DIAGNOSIS — H18413 Arcus senilis, bilateral: Secondary | ICD-10-CM | POA: Diagnosis not present

## 2017-09-19 DIAGNOSIS — H11153 Pinguecula, bilateral: Secondary | ICD-10-CM | POA: Diagnosis not present

## 2017-09-19 DIAGNOSIS — H35413 Lattice degeneration of retina, bilateral: Secondary | ICD-10-CM | POA: Diagnosis not present

## 2017-09-19 DIAGNOSIS — H40003 Preglaucoma, unspecified, bilateral: Secondary | ICD-10-CM | POA: Diagnosis not present

## 2017-09-19 DIAGNOSIS — H11823 Conjunctivochalasis, bilateral: Secondary | ICD-10-CM | POA: Diagnosis not present

## 2017-09-19 DIAGNOSIS — Z794 Long term (current) use of insulin: Secondary | ICD-10-CM | POA: Diagnosis not present

## 2017-09-19 DIAGNOSIS — H33303 Unspecified retinal break, bilateral: Secondary | ICD-10-CM | POA: Diagnosis not present

## 2017-09-19 DIAGNOSIS — H40023 Open angle with borderline findings, high risk, bilateral: Secondary | ICD-10-CM | POA: Diagnosis not present

## 2017-09-19 DIAGNOSIS — H2513 Age-related nuclear cataract, bilateral: Secondary | ICD-10-CM | POA: Diagnosis not present

## 2017-10-05 DIAGNOSIS — M545 Low back pain: Secondary | ICD-10-CM | POA: Diagnosis not present

## 2017-10-05 DIAGNOSIS — Z79891 Long term (current) use of opiate analgesic: Secondary | ICD-10-CM | POA: Diagnosis not present

## 2017-10-05 DIAGNOSIS — M542 Cervicalgia: Secondary | ICD-10-CM | POA: Diagnosis not present

## 2017-10-05 DIAGNOSIS — G894 Chronic pain syndrome: Secondary | ICD-10-CM | POA: Diagnosis not present

## 2017-10-11 DIAGNOSIS — H31092 Other chorioretinal scars, left eye: Secondary | ICD-10-CM | POA: Diagnosis not present

## 2017-10-11 DIAGNOSIS — H15833 Staphyloma posticum, bilateral: Secondary | ICD-10-CM | POA: Diagnosis not present

## 2017-10-11 DIAGNOSIS — H43823 Vitreomacular adhesion, bilateral: Secondary | ICD-10-CM | POA: Diagnosis not present

## 2017-10-11 DIAGNOSIS — H35432 Paving stone degeneration of retina, left eye: Secondary | ICD-10-CM | POA: Diagnosis not present

## 2017-11-15 DIAGNOSIS — E782 Mixed hyperlipidemia: Secondary | ICD-10-CM | POA: Diagnosis not present

## 2017-11-15 DIAGNOSIS — Z6838 Body mass index (BMI) 38.0-38.9, adult: Secondary | ICD-10-CM | POA: Diagnosis not present

## 2017-11-15 DIAGNOSIS — Z5181 Encounter for therapeutic drug level monitoring: Secondary | ICD-10-CM | POA: Diagnosis not present

## 2017-11-15 DIAGNOSIS — E1165 Type 2 diabetes mellitus with hyperglycemia: Secondary | ICD-10-CM | POA: Diagnosis not present

## 2017-11-15 DIAGNOSIS — Z794 Long term (current) use of insulin: Secondary | ICD-10-CM | POA: Diagnosis not present

## 2017-12-24 DIAGNOSIS — Z5181 Encounter for therapeutic drug level monitoring: Secondary | ICD-10-CM | POA: Diagnosis not present

## 2017-12-24 DIAGNOSIS — E1165 Type 2 diabetes mellitus with hyperglycemia: Secondary | ICD-10-CM | POA: Diagnosis not present

## 2017-12-24 DIAGNOSIS — M25572 Pain in left ankle and joints of left foot: Secondary | ICD-10-CM | POA: Diagnosis not present

## 2017-12-24 DIAGNOSIS — E782 Mixed hyperlipidemia: Secondary | ICD-10-CM | POA: Diagnosis not present

## 2017-12-24 DIAGNOSIS — Z6841 Body Mass Index (BMI) 40.0 and over, adult: Secondary | ICD-10-CM | POA: Diagnosis not present

## 2017-12-24 DIAGNOSIS — Z794 Long term (current) use of insulin: Secondary | ICD-10-CM | POA: Diagnosis not present

## 2018-02-28 DIAGNOSIS — E782 Mixed hyperlipidemia: Secondary | ICD-10-CM | POA: Diagnosis not present

## 2018-02-28 DIAGNOSIS — Z5181 Encounter for therapeutic drug level monitoring: Secondary | ICD-10-CM | POA: Diagnosis not present

## 2018-02-28 DIAGNOSIS — E1165 Type 2 diabetes mellitus with hyperglycemia: Secondary | ICD-10-CM | POA: Diagnosis not present

## 2018-02-28 DIAGNOSIS — Z794 Long term (current) use of insulin: Secondary | ICD-10-CM | POA: Diagnosis not present

## 2019-06-15 DIAGNOSIS — E1165 Type 2 diabetes mellitus with hyperglycemia: Secondary | ICD-10-CM | POA: Diagnosis not present

## 2019-06-15 DIAGNOSIS — Z794 Long term (current) use of insulin: Secondary | ICD-10-CM | POA: Diagnosis not present

## 2019-06-15 DIAGNOSIS — Z23 Encounter for immunization: Secondary | ICD-10-CM | POA: Diagnosis not present

## 2019-06-15 DIAGNOSIS — Z5181 Encounter for therapeutic drug level monitoring: Secondary | ICD-10-CM | POA: Diagnosis not present

## 2019-06-15 DIAGNOSIS — E782 Mixed hyperlipidemia: Secondary | ICD-10-CM | POA: Diagnosis not present

## 2019-07-14 DIAGNOSIS — N529 Male erectile dysfunction, unspecified: Secondary | ICD-10-CM | POA: Diagnosis not present

## 2019-08-10 DIAGNOSIS — N5201 Erectile dysfunction due to arterial insufficiency: Secondary | ICD-10-CM | POA: Diagnosis not present

## 2019-09-14 DIAGNOSIS — E1165 Type 2 diabetes mellitus with hyperglycemia: Secondary | ICD-10-CM | POA: Diagnosis not present

## 2019-09-14 DIAGNOSIS — E782 Mixed hyperlipidemia: Secondary | ICD-10-CM | POA: Diagnosis not present

## 2019-09-14 DIAGNOSIS — Z5181 Encounter for therapeutic drug level monitoring: Secondary | ICD-10-CM | POA: Diagnosis not present

## 2019-09-14 DIAGNOSIS — Z794 Long term (current) use of insulin: Secondary | ICD-10-CM | POA: Diagnosis not present

## 2019-09-14 DIAGNOSIS — Z23 Encounter for immunization: Secondary | ICD-10-CM | POA: Diagnosis not present

## 2019-12-21 DIAGNOSIS — E782 Mixed hyperlipidemia: Secondary | ICD-10-CM | POA: Diagnosis not present

## 2019-12-21 DIAGNOSIS — E1165 Type 2 diabetes mellitus with hyperglycemia: Secondary | ICD-10-CM | POA: Diagnosis not present

## 2019-12-21 DIAGNOSIS — Z5181 Encounter for therapeutic drug level monitoring: Secondary | ICD-10-CM | POA: Diagnosis not present

## 2019-12-21 DIAGNOSIS — Z794 Long term (current) use of insulin: Secondary | ICD-10-CM | POA: Diagnosis not present

## 2020-03-23 DIAGNOSIS — E1165 Type 2 diabetes mellitus with hyperglycemia: Secondary | ICD-10-CM | POA: Diagnosis not present

## 2020-06-29 DIAGNOSIS — Z794 Long term (current) use of insulin: Secondary | ICD-10-CM | POA: Diagnosis not present

## 2020-06-29 DIAGNOSIS — Z23 Encounter for immunization: Secondary | ICD-10-CM | POA: Diagnosis not present

## 2020-06-29 DIAGNOSIS — E782 Mixed hyperlipidemia: Secondary | ICD-10-CM | POA: Diagnosis not present

## 2020-06-29 DIAGNOSIS — E1165 Type 2 diabetes mellitus with hyperglycemia: Secondary | ICD-10-CM | POA: Diagnosis not present

## 2020-07-01 DIAGNOSIS — R21 Rash and other nonspecific skin eruption: Secondary | ICD-10-CM | POA: Diagnosis not present

## 2020-08-08 DIAGNOSIS — N5201 Erectile dysfunction due to arterial insufficiency: Secondary | ICD-10-CM | POA: Diagnosis not present

## 2020-08-08 DIAGNOSIS — Z125 Encounter for screening for malignant neoplasm of prostate: Secondary | ICD-10-CM | POA: Diagnosis not present

## 2020-09-29 DIAGNOSIS — J029 Acute pharyngitis, unspecified: Secondary | ICD-10-CM | POA: Diagnosis not present

## 2020-10-03 ENCOUNTER — Other Ambulatory Visit: Payer: Self-pay

## 2020-10-03 ENCOUNTER — Encounter (HOSPITAL_COMMUNITY): Payer: Self-pay | Admitting: Obstetrics and Gynecology

## 2020-10-03 DIAGNOSIS — Z5321 Procedure and treatment not carried out due to patient leaving prior to being seen by health care provider: Secondary | ICD-10-CM | POA: Diagnosis not present

## 2020-10-03 DIAGNOSIS — U071 COVID-19: Secondary | ICD-10-CM | POA: Insufficient documentation

## 2020-10-03 DIAGNOSIS — R030 Elevated blood-pressure reading, without diagnosis of hypertension: Secondary | ICD-10-CM | POA: Diagnosis present

## 2020-10-03 DIAGNOSIS — E860 Dehydration: Secondary | ICD-10-CM | POA: Diagnosis not present

## 2020-10-03 LAB — BASIC METABOLIC PANEL
Anion gap: 13 (ref 5–15)
BUN: 11 mg/dL (ref 6–20)
CO2: 25 mmol/L (ref 22–32)
Calcium: 8.5 mg/dL — ABNORMAL LOW (ref 8.9–10.3)
Chloride: 101 mmol/L (ref 98–111)
Creatinine, Ser: 0.81 mg/dL (ref 0.61–1.24)
GFR, Estimated: >60 mL/min (ref >60)
Glucose, Bld: 143 mg/dL — ABNORMAL HIGH (ref 70–99)
Potassium: 3.5 mmol/L (ref 3.5–5.1)
Sodium: 139 mmol/L (ref 135–145)

## 2020-10-03 LAB — CBC
HCT: 48.8 % (ref 39.0–52.0)
Hemoglobin: 16.7 g/dL (ref 13.0–17.0)
MCH: 28.8 pg (ref 26.0–34.0)
MCHC: 34.2 g/dL (ref 30.0–36.0)
MCV: 84.1 fL (ref 80.0–100.0)
Platelets: 171 10*3/uL (ref 150–400)
RBC: 5.8 MIL/uL (ref 4.22–5.81)
RDW: 13 % (ref 11.5–15.5)
WBC: 4.4 10*3/uL (ref 4.0–10.5)
nRBC: 0 % (ref 0.0–0.2)

## 2020-10-03 NOTE — ED Triage Notes (Signed)
Patient reports to the ER for feelings of dehydration. Patient also reports increased BP. Patient reports he started feeling sick last Saturday and tested positive on Thursday.

## 2020-10-04 ENCOUNTER — Emergency Department (HOSPITAL_COMMUNITY)
Admission: EM | Admit: 2020-10-04 | Discharge: 2020-10-04 | Disposition: A | Discharge status: Home / Self Care | Payer: Medicare Other | Attending: Emergency Medicine | Admitting: Emergency Medicine

## 2020-10-14 DIAGNOSIS — Z794 Long term (current) use of insulin: Secondary | ICD-10-CM | POA: Diagnosis not present

## 2020-10-14 DIAGNOSIS — E782 Mixed hyperlipidemia: Secondary | ICD-10-CM | POA: Diagnosis not present

## 2020-10-14 DIAGNOSIS — Z23 Encounter for immunization: Secondary | ICD-10-CM | POA: Diagnosis not present

## 2020-10-14 DIAGNOSIS — E1165 Type 2 diabetes mellitus with hyperglycemia: Secondary | ICD-10-CM | POA: Diagnosis not present

## 2020-10-31 DIAGNOSIS — I10 Essential (primary) hypertension: Secondary | ICD-10-CM | POA: Diagnosis not present

## 2020-11-01 DIAGNOSIS — E11319 Type 2 diabetes mellitus with unspecified diabetic retinopathy without macular edema: Secondary | ICD-10-CM | POA: Diagnosis not present

## 2021-02-01 DIAGNOSIS — I10 Essential (primary) hypertension: Secondary | ICD-10-CM | POA: Diagnosis not present

## 2021-02-01 DIAGNOSIS — R0683 Snoring: Secondary | ICD-10-CM | POA: Diagnosis not present

## 2021-02-01 DIAGNOSIS — E782 Mixed hyperlipidemia: Secondary | ICD-10-CM | POA: Diagnosis not present

## 2021-02-01 DIAGNOSIS — E1165 Type 2 diabetes mellitus with hyperglycemia: Secondary | ICD-10-CM | POA: Diagnosis not present

## 2021-02-20 DIAGNOSIS — G4719 Other hypersomnia: Secondary | ICD-10-CM | POA: Diagnosis not present

## 2021-02-20 DIAGNOSIS — Z794 Long term (current) use of insulin: Secondary | ICD-10-CM | POA: Diagnosis not present

## 2021-02-20 DIAGNOSIS — E1165 Type 2 diabetes mellitus with hyperglycemia: Secondary | ICD-10-CM | POA: Diagnosis not present

## 2021-03-03 DIAGNOSIS — Z794 Long term (current) use of insulin: Secondary | ICD-10-CM | POA: Diagnosis not present

## 2021-03-03 DIAGNOSIS — I10 Essential (primary) hypertension: Secondary | ICD-10-CM | POA: Diagnosis not present

## 2021-03-03 DIAGNOSIS — E1165 Type 2 diabetes mellitus with hyperglycemia: Secondary | ICD-10-CM | POA: Diagnosis not present

## 2021-03-03 DIAGNOSIS — G4733 Obstructive sleep apnea (adult) (pediatric): Secondary | ICD-10-CM | POA: Diagnosis not present

## 2021-04-17 DIAGNOSIS — E782 Mixed hyperlipidemia: Secondary | ICD-10-CM | POA: Diagnosis not present

## 2021-04-17 DIAGNOSIS — E1165 Type 2 diabetes mellitus with hyperglycemia: Secondary | ICD-10-CM | POA: Diagnosis not present

## 2021-04-17 DIAGNOSIS — Z794 Long term (current) use of insulin: Secondary | ICD-10-CM | POA: Diagnosis not present

## 2021-05-16 DIAGNOSIS — G4733 Obstructive sleep apnea (adult) (pediatric): Secondary | ICD-10-CM | POA: Diagnosis not present

## 2021-05-18 DIAGNOSIS — G4733 Obstructive sleep apnea (adult) (pediatric): Secondary | ICD-10-CM | POA: Diagnosis not present

## 2021-05-31 DIAGNOSIS — G4733 Obstructive sleep apnea (adult) (pediatric): Secondary | ICD-10-CM | POA: Diagnosis not present

## 2021-05-31 DIAGNOSIS — E1165 Type 2 diabetes mellitus with hyperglycemia: Secondary | ICD-10-CM | POA: Diagnosis not present

## 2021-05-31 DIAGNOSIS — E782 Mixed hyperlipidemia: Secondary | ICD-10-CM | POA: Diagnosis not present

## 2021-05-31 DIAGNOSIS — I10 Essential (primary) hypertension: Secondary | ICD-10-CM | POA: Diagnosis not present

## 2021-06-05 DIAGNOSIS — E1165 Type 2 diabetes mellitus with hyperglycemia: Secondary | ICD-10-CM | POA: Diagnosis not present

## 2021-06-05 DIAGNOSIS — G4733 Obstructive sleep apnea (adult) (pediatric): Secondary | ICD-10-CM | POA: Diagnosis not present

## 2021-06-05 DIAGNOSIS — Z794 Long term (current) use of insulin: Secondary | ICD-10-CM | POA: Diagnosis not present

## 2021-06-15 DIAGNOSIS — G4733 Obstructive sleep apnea (adult) (pediatric): Secondary | ICD-10-CM | POA: Diagnosis not present

## 2021-06-29 DIAGNOSIS — G4733 Obstructive sleep apnea (adult) (pediatric): Secondary | ICD-10-CM | POA: Diagnosis not present

## 2021-07-10 ENCOUNTER — Other Ambulatory Visit: Payer: Self-pay

## 2021-07-10 ENCOUNTER — Encounter: Payer: Medicare HMO | Attending: Family Medicine | Admitting: Nutrition

## 2021-07-10 DIAGNOSIS — E1065 Type 1 diabetes mellitus with hyperglycemia: Secondary | ICD-10-CM | POA: Diagnosis not present

## 2021-07-11 NOTE — Progress Notes (Signed)
Patient is here for general diabetes education.  He is seeing Dr. Buddy Duty for his diabetes and says he is taking U-500R insulin BID.  After review of his diet and insulin dose schedule, several problems were noted  He is up in the AM working for 2 hours before testing his blood sugars and taking his Insulin He is eating immediately after taking his insulin Meals are not balanced and blood sugars are variable based on the amounts of carbs eaten.   Pt. Not adjusting insulin doses based on meal size or blood sugar readings.  FBSs have been in the mid to upper 200s.   Pt. Is only  testing his blood sugar bid. Not treating low blood sugars appropriately Discussion:  He was told that blood sugars rise as the morning progresses, especially without insulin on board.  Will test when he first wakes up for a more accurate reading. He was told to wait 3min. to 1 hour after taking his insulin before eating breakfast and supper. Make sure there is protein at each meal and discuss with Dr. Buddy Duty next week the possibility of adjusting insulin doses in PM based on FBSs. Need for CGM.  Libre 3 placed on patient's arm, so that patient can see patterns in blood sugar readings in AM and during the night.  Asked him to shown Dr Buddy Duty this. Treat low blood sugar with fast acting carbs like juice, or soda-4 ounces, 3-4 glucose tablets, handful of raisins.   Stop eating peanut butter cracker to treat this.

## 2021-07-12 NOTE — Patient Instructions (Signed)
Test blood sugar when you first wakes up. wait 29min. to 1 hour after taking his insulin before eating breakfast and supper. Make sure there is protein at each meal and discuss with Dr. Buddy Duty next week the possibility of adjusting insulin doses in PM based on FBSs. 4.  Discuss with Dr Buddy Duty or Dr. Loma Sousa the need for presciption for  [facility] 3 so he     can see patterns in blood sugar readings in AM and during the night.  Treat low blood sugar with fast acting carbs like juice, or soda-4 ounces, 3-4 glucose tablets, handful of raisins.   Stop eating peanut butter cracker to treat this.

## 2021-07-16 DIAGNOSIS — G4733 Obstructive sleep apnea (adult) (pediatric): Secondary | ICD-10-CM | POA: Diagnosis not present

## 2021-07-20 ENCOUNTER — Ambulatory Visit
Admission: RE | Admit: 2021-07-20 | Discharge: 2021-07-20 | Disposition: A | Discharge status: Home / Self Care | Payer: Medicare HMO | Source: Ambulatory Visit | Attending: Internal Medicine | Admitting: Internal Medicine

## 2021-07-20 ENCOUNTER — Other Ambulatory Visit: Payer: Self-pay | Admitting: Internal Medicine

## 2021-07-20 DIAGNOSIS — Z794 Long term (current) use of insulin: Secondary | ICD-10-CM | POA: Diagnosis not present

## 2021-07-20 DIAGNOSIS — E1165 Type 2 diabetes mellitus with hyperglycemia: Secondary | ICD-10-CM | POA: Diagnosis not present

## 2021-07-20 DIAGNOSIS — M79672 Pain in left foot: Secondary | ICD-10-CM

## 2021-07-20 DIAGNOSIS — E782 Mixed hyperlipidemia: Secondary | ICD-10-CM | POA: Diagnosis not present

## 2021-07-21 DIAGNOSIS — Z23 Encounter for immunization: Secondary | ICD-10-CM | POA: Diagnosis not present

## 2021-08-01 ENCOUNTER — Encounter: Payer: Self-pay | Admitting: Podiatry

## 2021-08-01 ENCOUNTER — Ambulatory Visit (INDEPENDENT_AMBULATORY_CARE_PROVIDER_SITE_OTHER): Payer: Medicare HMO | Admitting: Podiatry

## 2021-08-01 ENCOUNTER — Other Ambulatory Visit: Payer: Self-pay

## 2021-08-01 DIAGNOSIS — E1142 Type 2 diabetes mellitus with diabetic polyneuropathy: Secondary | ICD-10-CM

## 2021-08-01 DIAGNOSIS — Z794 Long term (current) use of insulin: Secondary | ICD-10-CM

## 2021-08-01 NOTE — Progress Notes (Signed)
  Subjective:  Patient ID: Vincent Mcintyre, male    DOB: 08-Jul-1966,   MRN: [id number]  Chief Complaint  Patient presents with   Foot Swelling    Bilateral foot edema associated with numbness. Non-painful. X 2 weeks.     55 y.o. male presents for diabetic foot care. Relates bilateral foot pain and swelling. States he will feel on and off pain and coldness in the ball and toes of his feet. Relates a tingling feeling. Does have a history of back issues and is in pain management for back issues. Was supposed to have surgery but his A1c has been too high. Last A1c was 8.3 on 07/20/21.  Denies any other pedal complaints. Denies n/v/f/c.   PCP: Delrae Rend MD   Past Medical History:  Diagnosis Date   Diabetes mellitus     Dr. Leighton Ruff - [facility]   Hyperlipidemia    Hypertension     Objective:  Physical Exam: Vascular: DP/PT pulses 2/4 bilateral. CFT <3 seconds. Normal hair growth on digits. No edema.  Skin. No lacerations or abrasions bilateral feet. Nails 1-5 normal in appearance.  Musculoskeletal: MMT 5/5 bilateral lower extremities in DF, PF, Inversion and Eversion. Deceased ROM in DF of ankle joint. No tenderness to palpation.  Neurological: Sensation intact to light touch. Protective sensation intact.   Assessment:   1. Type 2 diabetes mellitus with diabetic polyneuropathy, with long-term current use of insulin ([place])      Plan:  Patient was evaluated and treated and all questions answered. -Discussed and educated patient on diabetic foot care, especially with regards to the vascular, neurological and musculoskeletal systems.  -Stressed the importance of good glycemic control and the detriment of not  controlling glucose levels in relation to the foot. -Discussed supportive shoes at all times and checking feet regularly.  -Discussed neuropathy vs radiculopathy. Patient is currently in pain management . Feel this pain in his feet could be related to his back vs some level  of diabetic neuropathy.  -Answered all patient questions -Patient to return  in 1 year for diabetic foot check.  -Patient advised to call the office if any problems or questions arise in the meantime.   Lorenda Peck, DPM

## 2021-08-07 DIAGNOSIS — Z125 Encounter for screening for malignant neoplasm of prostate: Secondary | ICD-10-CM | POA: Diagnosis not present

## 2021-08-10 DIAGNOSIS — N5201 Erectile dysfunction due to arterial insufficiency: Secondary | ICD-10-CM | POA: Diagnosis not present

## 2021-08-15 DIAGNOSIS — G4733 Obstructive sleep apnea (adult) (pediatric): Secondary | ICD-10-CM | POA: Diagnosis not present

## 2021-08-23 DIAGNOSIS — R0602 Shortness of breath: Secondary | ICD-10-CM | POA: Diagnosis not present

## 2021-08-23 DIAGNOSIS — G4733 Obstructive sleep apnea (adult) (pediatric): Secondary | ICD-10-CM | POA: Diagnosis not present

## 2021-08-23 DIAGNOSIS — G4734 Idiopathic sleep related nonobstructive alveolar hypoventilation: Secondary | ICD-10-CM | POA: Diagnosis not present

## 2021-09-05 DIAGNOSIS — R0902 Hypoxemia: Secondary | ICD-10-CM | POA: Diagnosis not present

## 2021-09-15 DIAGNOSIS — G4733 Obstructive sleep apnea (adult) (pediatric): Secondary | ICD-10-CM | POA: Diagnosis not present

## 2021-10-12 DIAGNOSIS — G4733 Obstructive sleep apnea (adult) (pediatric): Secondary | ICD-10-CM | POA: Diagnosis not present

## 2021-10-16 DIAGNOSIS — G4733 Obstructive sleep apnea (adult) (pediatric): Secondary | ICD-10-CM | POA: Diagnosis not present

## 2021-10-17 DIAGNOSIS — G4733 Obstructive sleep apnea (adult) (pediatric): Secondary | ICD-10-CM | POA: Diagnosis not present

## 2021-10-20 DIAGNOSIS — Z794 Long term (current) use of insulin: Secondary | ICD-10-CM | POA: Diagnosis not present

## 2021-10-20 DIAGNOSIS — E1165 Type 2 diabetes mellitus with hyperglycemia: Secondary | ICD-10-CM | POA: Diagnosis not present

## 2021-10-20 DIAGNOSIS — E782 Mixed hyperlipidemia: Secondary | ICD-10-CM | POA: Diagnosis not present

## 2021-11-13 DIAGNOSIS — G4733 Obstructive sleep apnea (adult) (pediatric): Secondary | ICD-10-CM | POA: Diagnosis not present

## 2021-11-29 DIAGNOSIS — Z794 Long term (current) use of insulin: Secondary | ICD-10-CM | POA: Diagnosis not present

## 2021-11-29 DIAGNOSIS — R0602 Shortness of breath: Secondary | ICD-10-CM | POA: Diagnosis not present

## 2021-11-29 DIAGNOSIS — G4733 Obstructive sleep apnea (adult) (pediatric): Secondary | ICD-10-CM | POA: Diagnosis not present

## 2021-11-29 DIAGNOSIS — I10 Essential (primary) hypertension: Secondary | ICD-10-CM | POA: Diagnosis not present

## 2021-11-29 DIAGNOSIS — E1165 Type 2 diabetes mellitus with hyperglycemia: Secondary | ICD-10-CM | POA: Diagnosis not present

## 2021-11-29 DIAGNOSIS — E782 Mixed hyperlipidemia: Secondary | ICD-10-CM | POA: Diagnosis not present

## 2021-12-05 DIAGNOSIS — Z0000 Encounter for general adult medical examination without abnormal findings: Secondary | ICD-10-CM | POA: Diagnosis not present

## 2021-12-05 DIAGNOSIS — Z23 Encounter for immunization: Secondary | ICD-10-CM | POA: Diagnosis not present

## 2021-12-14 DIAGNOSIS — G4733 Obstructive sleep apnea (adult) (pediatric): Secondary | ICD-10-CM | POA: Diagnosis not present

## 2021-12-25 ENCOUNTER — Encounter: Payer: Self-pay | Admitting: Pulmonary Disease

## 2021-12-25 ENCOUNTER — Ambulatory Visit (INDEPENDENT_AMBULATORY_CARE_PROVIDER_SITE_OTHER): Payer: Medicare HMO | Admitting: Pulmonary Disease

## 2021-12-25 VITALS — BP 136/68 | HR 89 | Temp 98.0°F | Ht 70.0 in | Wt 310.3 lb

## 2021-12-25 DIAGNOSIS — R0609 Other forms of dyspnea: Secondary | ICD-10-CM | POA: Diagnosis not present

## 2021-12-25 DIAGNOSIS — R053 Chronic cough: Secondary | ICD-10-CM

## 2021-12-25 MED ORDER — BUDESONIDE-FORMOTEROL FUMARATE 80-4.5 MCG/ACT IN AERO: 2.0000 | INHALATION_SPRAY | Freq: Two times a day (BID) | RESPIRATORY_TRACT | 12 refills | Status: DC

## 2021-12-25 NOTE — Patient Instructions (Addendum)
Nice to see you ? ?Use Symbicort 2 puffs in the morning and 2 puffs in the evening, everyday. Rinse you mouth with water after every use. ? ?If it is not helping in the next few weeks I would recommend a chest xray and a heart ultrasound ? ?Return to clinic in 6 weeks or sooner as needed ?

## 2021-12-28 ENCOUNTER — Telehealth: Payer: Self-pay | Admitting: Pulmonary Disease

## 2021-12-28 NOTE — Telephone Encounter (Signed)
Called patient and informed him that it was okay for him to throw away the extra AVS that was given to him. I gave him my apologies. Nothing further needed.  ?

## 2022-01-13 DIAGNOSIS — G4733 Obstructive sleep apnea (adult) (pediatric): Secondary | ICD-10-CM | POA: Diagnosis not present

## 2022-01-18 DIAGNOSIS — E782 Mixed hyperlipidemia: Secondary | ICD-10-CM | POA: Diagnosis not present

## 2022-01-18 DIAGNOSIS — E1165 Type 2 diabetes mellitus with hyperglycemia: Secondary | ICD-10-CM | POA: Diagnosis not present

## 2022-01-18 DIAGNOSIS — Z794 Long term (current) use of insulin: Secondary | ICD-10-CM | POA: Diagnosis not present

## 2022-02-06 ENCOUNTER — Encounter: Payer: Self-pay | Admitting: Pulmonary Disease

## 2022-02-06 ENCOUNTER — Ambulatory Visit (INDEPENDENT_AMBULATORY_CARE_PROVIDER_SITE_OTHER): Payer: Medicare HMO | Admitting: Pulmonary Disease

## 2022-02-06 VITALS — BP 124/62 | HR 91 | Temp 98.4°F | Ht 70.0 in | Wt 308.8 lb

## 2022-02-06 DIAGNOSIS — M1990 Unspecified osteoarthritis, unspecified site: Secondary | ICD-10-CM | POA: Diagnosis not present

## 2022-02-06 DIAGNOSIS — J45909 Unspecified asthma, uncomplicated: Secondary | ICD-10-CM | POA: Diagnosis not present

## 2022-02-06 DIAGNOSIS — Z7951 Long term (current) use of inhaled steroids: Secondary | ICD-10-CM | POA: Diagnosis not present

## 2022-02-06 DIAGNOSIS — N529 Male erectile dysfunction, unspecified: Secondary | ICD-10-CM | POA: Diagnosis not present

## 2022-02-06 DIAGNOSIS — Z794 Long term (current) use of insulin: Secondary | ICD-10-CM | POA: Diagnosis not present

## 2022-02-06 DIAGNOSIS — R053 Chronic cough: Secondary | ICD-10-CM

## 2022-02-06 DIAGNOSIS — E1142 Type 2 diabetes mellitus with diabetic polyneuropathy: Secondary | ICD-10-CM | POA: Diagnosis not present

## 2022-02-06 DIAGNOSIS — G4733 Obstructive sleep apnea (adult) (pediatric): Secondary | ICD-10-CM | POA: Diagnosis not present

## 2022-02-06 DIAGNOSIS — E785 Hyperlipidemia, unspecified: Secondary | ICD-10-CM | POA: Diagnosis not present

## 2022-02-06 DIAGNOSIS — R0602 Shortness of breath: Secondary | ICD-10-CM | POA: Diagnosis not present

## 2022-02-06 DIAGNOSIS — Z6841 Body Mass Index (BMI) 40.0 and over, adult: Secondary | ICD-10-CM | POA: Diagnosis not present

## 2022-02-06 DIAGNOSIS — I10 Essential (primary) hypertension: Secondary | ICD-10-CM | POA: Diagnosis not present

## 2022-02-06 NOTE — Progress Notes (Signed)
$'@Patient'K$  ID: Vincent Mcintyre, male    DOB: October 16, 1965, 56 y.o.   MRN: [id number]  Chief Complaint  Patient presents with   Follow-up    Pt states that he feels like his breathing is doing better. Pt is currently on Symbicort and patient states he feel like it is working well for his DOE. Patient states he wears his cpap every night with no issues.     Referring provider: Marda Stalker, PA-C  HPI:   56 y.o. man whom we are seeing in follow up for evaluation of chronic cough as well as dyspnea on exertion/shortness of breath.  Note from referring provider reviewed.  At last visit was counseled to use Symbicort 2 puffs twice a day every day.  He was using it intermittently.  Since then, he thinks the instances of shortness of breath have improved.  Still occasional where he cannot get a deep breath or feels a little tight.  But the frequency has improved.  Overall his dyspnea on exertion is largely unchanged.  He feels a lot this is limited due to chronic pain, sciatica on the right side.  He does not not think symptoms are worsening in any way.  He again endorses 40 pound weight gain since starting CPAP therapy.  He is concerned that the CPAP therapy has led to all of the issues for which we are seeing him.  HPI at initial visit: Patient was in normal state of health.  Started on CPAP therapy for diagnosis of obstructive sleep apnea in the fall 2022.  Since then he reports cough at night.  He reports good hygiene to his CPAP machine.  Cleans it regularly.  No issues.  Orders and changes new supplies at recommended intervals.  Cough worsening lies down.  At night.  Present in the morning.  Gradually improved to the day.  May be worse when lying supine.  No other environmental or seasonal factors he can identify to make things better or worse.  Was put on Symbicort 09/2020.  He is using this at night.  Sometimes in the mornings.  Not twice a day.  Has helped a little bit in terms of  severity/frequency of cough.  In addition, he reports shortness of breath.  Again worse in the evenings, worse when lying supine.  Does not take long to recover per his report.  He reports good adherence to CPAP.  The shortness of breath makes it challenging, when he gets short of breath sometimes he will take the CPAP off for the night.  Reviewed most recent chest imaging 06/2018 that shows hyperinflation on the lateral view, otherwise clear lungs on my interpretation.  No history of TTE that I can review.  PMH: Hypertension, diabetes Surgical history: I&D of perirectal abscess Family history:History reviewed. No pertinent family history. Social history: Never smoker, lives in [place] / Pulmonary Flowsheets:   ACT:      View : No data to display.           MMRC:     View : No data to display.           Epworth:      View : No data to display.           Tests:   FENO:  No results found for: NITRICOXIDE  PFT:     View : No data to display.           WALK:  View : No data to display.           Imaging: No results found. Personally reviewed and as per EMR and discussion in this note Lab Results: Personally reviewed CBC    Component Value Date/Time   WBC 4.4 10/03/2020 1904   RBC 5.80 10/03/2020 1904   HGB 16.7 10/03/2020 1904   HCT 48.8 10/03/2020 1904   PLT 171 10/03/2020 1904   MCV 84.1 10/03/2020 1904   MCH 28.8 10/03/2020 1904   MCHC 34.2 10/03/2020 1904   RDW 13.0 10/03/2020 1904   LYMPHSABS 1.9 11/25/2014 1932   MONOABS 1.0 11/25/2014 1932   EOSABS 0.0 11/25/2014 1932   BASOSABS 0.0 11/25/2014 1932    BMET    Component Value Date/Time   NA 139 10/03/2020 1904   K 3.5 10/03/2020 1904   CL 101 10/03/2020 1904   CO2 25 10/03/2020 1904   GLUCOSE 143 (H) 10/03/2020 1904   BUN 11 10/03/2020 1904   CREATININE 0.81 10/03/2020 1904   CALCIUM 8.5 (L) 10/03/2020 1904   GFRNONAA >60 10/03/2020 1904   GFRAA  86 (L) 12/07/2014 1749    BNP No results found for: BNP  ProBNP No results found for: PROBNP  Specialty Problems   None   No Known Allergies  Immunization History  Administered Date(s) Administered   Influenza,inj,Quad PF,6+ Mos 11/30/2014   Pneumococcal Polysaccharide-23 11/30/2014    Past Medical History:  Diagnosis Date   Diabetes mellitus     Dr. Leighton Ruff [facility] PCP   Hyperlipidemia    Hypertension     Tobacco History: Social History   Tobacco Use  Smoking Status Never  Smokeless Tobacco Never   Counseling given: Not Answered   Continue to not smoke  Outpatient Encounter Medications as of 02/06/2022  Medication Sig   amLODipine-olmesartan (AZOR) 5-40 MG tablet Take 1 tablet by mouth daily.   atorvastatin (LIPITOR) 40 MG tablet atorvastatin 40 mg tablet  TAKE 1 TABLET BY MOUTH EVERY DAY   budesonide-formoterol (SYMBICORT) 80-4.5 MCG/ACT inhaler Inhale 2 puffs into the lungs 2 (two) times daily.   HYDROcodone-acetaminophen (NORCO/VICODIN) 5-325 MG tablet 2 to 3 tablets as needed   insulin lispro (HUMALOG) 100 UNIT/ML KwikPen Humalog KwikPen (U-100) Insulin 100 unit/mL subcutaneous  10 UNITS OR AS DIRECTED AT BEDTIME SUBCUTANEOUS 90   Insulin Pen Needle 29G X 5MM MISC Use with insulin pens as directed   metFORMIN (GLUCOPHAGE) 500 MG tablet Take 1,000 mg by mouth 2 (two) times daily with a meal.   naproxen (NAPROSYN) 375 MG tablet Take 750 mg by mouth 2 (two) times daily as needed for moderate pain (takes with hydrocodone).    sildenafil (REVATIO) 20 MG tablet 1 tablet   No facility-administered encounter medications on file as of 02/06/2022.     Review of Systems  Review of Systems  N/a  Physical Exam  BP 124/62 (BP Location: Left Arm, Patient Position: Sitting, Cuff Size: Normal)   Pulse 91   Temp 98.4 F (36.9 C) (Oral)   Ht '5\' 10"'$  (1.778 m)   Wt (!) 308 lb 12.8 oz (140.1 kg)   SpO2 96%   BMI 44.31 kg/m   Wt Readings from Last 5  Encounters:  02/06/22 (!) 308 lb 12.8 oz (140.1 kg)  12/25/21 (!) 310 lb 4.8 oz (140.8 kg)  10/03/20 291 lb 4.8 oz (132.1 kg)  05/09/15 267 lb (121.1 kg)  02/28/15 272 lb (123.4 kg)    BMI Readings from Last 5 Encounters:  02/06/22 44.31 kg/m  12/25/21 44.52 kg/m  10/03/20 41.80 kg/m  05/09/15 38.31 kg/m  02/28/15 39.03 kg/m     Physical Exam General: Well-appearing, no acute distress Eyes: EOMI, intact Neck: Supple, no JVP appreciated sitting upright, habitus does make total evaluation difficult Pulmonary: Clear, distant Cardiovascular: Regular rate and rhythm, no murmur appreciated Abdomen: Nondistended, bowel sounds present MSK: No synovitis, no joint effusion Neuro: Normal gait, no weakness Psych: Normal mood, full affect   Assessment & Plan:   Chronic cough: Worse at night. Denies GERD symptoms. Cough variant asthma possible.  Has improved with Symbicort it seems.  Shortness of breath: Again worse in the evenings.  Query underlying asthma.  Evidence of hyperinflation on past chest x-ray, 06/2018.  Nearly equivocal orthopnea.  Worse since CPAP therapy.  With good adherence to Symbicort, sensation of shortness of breath has improved, episodes at rest decreased in frequency..  Note weight is gradually increasing that, could be contribution from weight, atelectasis.  If worsens in the future, would recommend echocardiogram, repeat chest imaging.   Return in about 6 months (around 08/09/2022).   Lanier Clam, MD 02/06/2022

## 2022-02-06 NOTE — Progress Notes (Signed)
$'@Patient'p$  ID: Vincent Mcintyre, male    DOB: Jun 21, 1966, 56 y.o.   MRN: [id number]  Chief Complaint  Patient presents with   Consult    Consult for chronic cough. He states sometimes at night he feels like he has to take a very deep breath to help him catch his breath. He states all these symptoms stated happening when he started cpap therapy in august.     Referring provider: Marda Stalker, PA-C  HPI:   56 y.o. man whom we are seeing in consultation for evaluation of chronic cough as well as dyspnea on exertion/shortness of breath.  Note from referring provider reviewed.  Patient was in normal state of health.  Started on CPAP therapy for diagnosis of obstructive sleep apnea in the fall 2022.  Since then he reports cough at night.  He reports good hygiene to his CPAP machine.  Cleans it regularly.  No issues.  Orders and changes new supplies at recommended intervals.  Cough worsening lies down.  At night.  Present in the morning.  Gradually improved to the day.  May be worse when lying supine.  No other environmental or seasonal factors he can identify to make things better or worse.  Was put on Symbicort 09/2020.  He is using this at night.  Sometimes in the mornings.  Not twice a day.  Has helped a little bit in terms of severity/frequency of cough.  In addition, he reports shortness of breath.  Again worse in the evenings, worse when lying supine.  Does not take long to recover per his report.  He reports good adherence to CPAP.  The shortness of breath makes it challenging, when he gets short of breath sometimes he will take the CPAP off for the night.  Reviewed most recent chest imaging 06/2018 that shows hyperinflation on the lateral view, otherwise clear lungs on my interpretation.  No history of TTE that I can review.  PMH: Hypertension, diabetes Surgical history: I&D of perirectal abscess Family history:History reviewed. No pertinent family history. Social history: Never smoker,  lives in [place] / Pulmonary Flowsheets:   ACT:      View : No data to display.          MMRC:     View : No data to display.          Epworth:      View : No data to display.          Tests:   FENO:  No results found for: NITRICOXIDE  PFT:     View : No data to display.          WALK:      View : No data to display.          Imaging: No results found. Personally reviewed and as per EMR and discussion in this note Lab Results: Personally reviewed CBC    Component Value Date/Time   WBC 4.4 10/03/2020 1904   RBC 5.80 10/03/2020 1904   HGB 16.7 10/03/2020 1904   HCT 48.8 10/03/2020 1904   PLT 171 10/03/2020 1904   MCV 84.1 10/03/2020 1904   MCH 28.8 10/03/2020 1904   MCHC 34.2 10/03/2020 1904   RDW 13.0 10/03/2020 1904   LYMPHSABS 1.9 11/25/2014 1932   MONOABS 1.0 11/25/2014 1932   EOSABS 0.0 11/25/2014 1932   BASOSABS 0.0 11/25/2014 1932    BMET    Component Value Date/Time   NA 139 10/03/2020 1904  K 3.5 10/03/2020 1904   CL 101 10/03/2020 1904   CO2 25 10/03/2020 1904   GLUCOSE 143 (H) 10/03/2020 1904   BUN 11 10/03/2020 1904   CREATININE 0.81 10/03/2020 1904   CALCIUM 8.5 (L) 10/03/2020 1904   GFRNONAA >60 10/03/2020 1904   GFRAA 86 (L) 12/07/2014 1749    BNP No results found for: BNP  ProBNP No results found for: PROBNP  Specialty Problems   None   No Known Allergies  Immunization History  Administered Date(s) Administered   Influenza,inj,Quad PF,6+ Mos 11/30/2014   Pneumococcal Polysaccharide-23 11/30/2014    Past Medical History:  Diagnosis Date   Diabetes mellitus     Dr. Leighton Ruff - [facility] PCP   Hyperlipidemia    Hypertension     Tobacco History: Social History   Tobacco Use  Smoking Status Never  Smokeless Tobacco Never   Counseling given: Not Answered   Continue to not smoke  Outpatient Encounter Medications as of 12/25/2021  Medication Sig    amLODipine-olmesartan (AZOR) 5-40 MG tablet Take 1 tablet by mouth daily.   atorvastatin (LIPITOR) 40 MG tablet atorvastatin 40 mg tablet  TAKE 1 TABLET BY MOUTH EVERY DAY   budesonide-formoterol (SYMBICORT) 80-4.5 MCG/ACT inhaler Inhale 2 puffs into the lungs 2 (two) times daily.   HYDROcodone-acetaminophen (NORCO/VICODIN) 5-325 MG tablet 2 to 3 tablets as needed   insulin lispro (HUMALOG) 100 UNIT/ML KwikPen Humalog KwikPen (U-100) Insulin 100 unit/mL subcutaneous  10 UNITS OR AS DIRECTED AT BEDTIME SUBCUTANEOUS 90   Insulin Pen Needle 29G X 5MM MISC Use with insulin pens as directed   metFORMIN (GLUCOPHAGE) 500 MG tablet Take 1,000 mg by mouth 2 (two) times daily with a meal.   naproxen (NAPROSYN) 375 MG tablet Take 750 mg by mouth 2 (two) times daily as needed for moderate pain (takes with hydrocodone).    sildenafil (REVATIO) 20 MG tablet 1 tablet   [DISCONTINUED] amLODipine (NORVASC) 10 MG tablet Take 10 mg by mouth daily.   [DISCONTINUED] lisinopril (PRINIVIL,ZESTRIL) 2.5 MG tablet Take 2.5 mg by mouth daily.   No facility-administered encounter medications on file as of 12/25/2021.     Review of Systems  Review of Systems  No chest pain with exertion.  Denies any worsening lower extremity swelling.  Comprehensive review of systems otherwise negative. Physical Exam  BP 136/68 (BP Location: Left Arm, Patient Position: Sitting, Cuff Size: Normal)   Pulse 89   Temp 98 F (36.7 C) (Oral)   Ht '5\' 10"'$  (1.778 m)   Wt (!) 310 lb 4.8 oz (140.8 kg)   SpO2 97%   BMI 44.52 kg/m   Wt Readings from Last 5 Encounters:  12/25/21 (!) 310 lb 4.8 oz (140.8 kg)  10/03/20 291 lb 4.8 oz (132.1 kg)  05/09/15 267 lb (121.1 kg)  02/28/15 272 lb (123.4 kg)  12/27/14 273 lb (123.8 kg)    BMI Readings from Last 5 Encounters:  12/25/21 44.52 kg/m  10/03/20 41.80 kg/m  05/09/15 38.31 kg/m  02/28/15 39.03 kg/m  12/27/14 39.17 kg/m     Physical Exam General: Well-appearing, no acute  distress Eyes: EOMI, intact Neck: Supple, no JVP appreciated sitting upright, habitus does make total evaluation difficult Pulmonary: Clear, distant Cardiovascular: Regular rate and rhythm, no murmur appreciated Abdomen: Nondistended, bowel sounds present MSK: No synovitis, no joint effusion Neuro: Normal gait, no weakness Psych: Normal mood, full affect   Assessment & Plan:   Chronic cough: Worse at night. Denies GERD symptoms. Cough variant  asthma possible. Mild improvement with symbicort but not using as prescribed. New prescription but does Symbicort 2 puffs twice a day, stressed importance of twice daily usage.  Shortness of breath: Again worse in the evenings.  Clary underlying asthma.  Evidence of hyperinflation on past chest x-ray, 06/2018.  Nearly equivocal orthopnea.  Worse since CPAP therapy.  Symbicort as above.  Note weight is gradually increasing that, could be contribution from weight.  If not improving, would recommend echocardiogram, repeat chest imaging.   Return in about 6 weeks (around 02/05/2022).   Lanier Clam, MD 02/06/2022

## 2022-02-06 NOTE — Patient Instructions (Signed)
Nice to see you again  No changes, continue Symbicort 2 puffs twice a day.  Rinse your mouth after every use.  I will see you back in 6 months, if symptoms worsen or other concerns arise in the interim, please call us and we will see you sooner.  Return to clinic in 6 months or sooner as needed with Dr. Silas Flood

## 2022-02-13 DIAGNOSIS — G4733 Obstructive sleep apnea (adult) (pediatric): Secondary | ICD-10-CM | POA: Diagnosis not present

## 2022-03-15 DIAGNOSIS — G4733 Obstructive sleep apnea (adult) (pediatric): Secondary | ICD-10-CM | POA: Diagnosis not present

## 2022-04-19 DIAGNOSIS — E782 Mixed hyperlipidemia: Secondary | ICD-10-CM | POA: Diagnosis not present

## 2022-04-19 DIAGNOSIS — Z794 Long term (current) use of insulin: Secondary | ICD-10-CM | POA: Diagnosis not present

## 2022-04-19 DIAGNOSIS — E1165 Type 2 diabetes mellitus with hyperglycemia: Secondary | ICD-10-CM | POA: Diagnosis not present

## 2022-05-02 DIAGNOSIS — L237 Allergic contact dermatitis due to plants, except food: Secondary | ICD-10-CM | POA: Diagnosis not present

## 2022-05-02 DIAGNOSIS — E782 Mixed hyperlipidemia: Secondary | ICD-10-CM | POA: Diagnosis not present

## 2022-05-02 DIAGNOSIS — E1165 Type 2 diabetes mellitus with hyperglycemia: Secondary | ICD-10-CM | POA: Diagnosis not present

## 2022-05-02 DIAGNOSIS — Z6841 Body Mass Index (BMI) 40.0 and over, adult: Secondary | ICD-10-CM | POA: Diagnosis not present

## 2022-05-02 DIAGNOSIS — I10 Essential (primary) hypertension: Secondary | ICD-10-CM | POA: Diagnosis not present

## 2022-05-07 DIAGNOSIS — G4733 Obstructive sleep apnea (adult) (pediatric): Secondary | ICD-10-CM | POA: Diagnosis not present

## 2022-05-10 DIAGNOSIS — Z0100 Encounter for examination of eyes and vision without abnormal findings: Secondary | ICD-10-CM | POA: Diagnosis not present

## 2022-05-10 DIAGNOSIS — E11311 Type 2 diabetes mellitus with unspecified diabetic retinopathy with macular edema: Secondary | ICD-10-CM | POA: Diagnosis not present

## 2022-06-07 DIAGNOSIS — K648 Other hemorrhoids: Secondary | ICD-10-CM | POA: Diagnosis not present

## 2022-06-07 DIAGNOSIS — Z1211 Encounter for screening for malignant neoplasm of colon: Secondary | ICD-10-CM | POA: Diagnosis not present

## 2022-06-07 DIAGNOSIS — D123 Benign neoplasm of transverse colon: Secondary | ICD-10-CM | POA: Diagnosis not present

## 2022-06-07 DIAGNOSIS — D124 Benign neoplasm of descending colon: Secondary | ICD-10-CM | POA: Diagnosis not present

## 2022-06-11 DIAGNOSIS — D123 Benign neoplasm of transverse colon: Secondary | ICD-10-CM | POA: Diagnosis not present

## 2022-06-11 DIAGNOSIS — D124 Benign neoplasm of descending colon: Secondary | ICD-10-CM | POA: Diagnosis not present

## 2022-07-19 DIAGNOSIS — E1165 Type 2 diabetes mellitus with hyperglycemia: Secondary | ICD-10-CM | POA: Diagnosis not present

## 2022-07-19 DIAGNOSIS — Z794 Long term (current) use of insulin: Secondary | ICD-10-CM | POA: Diagnosis not present

## 2022-07-19 DIAGNOSIS — Z23 Encounter for immunization: Secondary | ICD-10-CM | POA: Diagnosis not present

## 2022-07-19 DIAGNOSIS — E782 Mixed hyperlipidemia: Secondary | ICD-10-CM | POA: Diagnosis not present

## 2022-08-10 DIAGNOSIS — R948 Abnormal results of function studies of other organs and systems: Secondary | ICD-10-CM | POA: Diagnosis not present

## 2022-08-10 DIAGNOSIS — N5201 Erectile dysfunction due to arterial insufficiency: Secondary | ICD-10-CM | POA: Diagnosis not present

## 2022-08-17 DIAGNOSIS — N5201 Erectile dysfunction due to arterial insufficiency: Secondary | ICD-10-CM | POA: Diagnosis not present

## 2022-08-23 DIAGNOSIS — G4733 Obstructive sleep apnea (adult) (pediatric): Secondary | ICD-10-CM | POA: Diagnosis not present

## 2022-08-23 DIAGNOSIS — E1165 Type 2 diabetes mellitus with hyperglycemia: Secondary | ICD-10-CM | POA: Diagnosis not present

## 2022-08-23 DIAGNOSIS — I10 Essential (primary) hypertension: Secondary | ICD-10-CM | POA: Diagnosis not present

## 2022-09-06 ENCOUNTER — Telehealth: Payer: Self-pay

## 2022-09-06 NOTE — Patient Outreach (Signed)
  Care Coordination   Initial Visit Note   09/06/2022 Name: Vincent Mcintyre MRN: [id number] DOB: [birth year]  Vincent Mcintyre is a 56 y.o. year old male who sees Marda Stalker, [place] for primary care. I spoke with  Antonietta Breach by phone today.  What matters to the patients health and wellness today?  I am doing good today.  My blood sugars are averaging 152-158 and I see Dr. Buddy Duty regularly    Goals Addressed             This Visit's Progress    COMPLETED: Care Coordination Activities - no follow up required       Care Coordination Interventions: Provided education to patient re: care coordination services Assessed social determinant of health barriers          SDOH assessments and interventions completed:  Yes  SDOH Interventions Today    Flowsheet Row Most Recent Value  SDOH Interventions   Food Insecurity Interventions Intervention Not Indicated  Housing Interventions Intervention Not Indicated  Transportation Interventions Intervention Not Indicated  Utilities Interventions Intervention Not Indicated        Care Coordination Interventions:  Yes, provided   Follow up plan: No further intervention required.   Encounter Outcome:  Pt. Visit Completed  Peter Garter RN, BSN,CCM, CDE Care Management Coordinator [facility] [phone]

## 2022-09-06 NOTE — Patient Instructions (Signed)
Visit Information  Thank you for taking time to visit with me today. Please don't hesitate to contact me if I can be of assistance to you.   Following are the goals we discussed today:   Goals Addressed             This Visit's Progress    COMPLETED: Care Coordination Activities - no follow up required       Care Coordination Interventions: Provided education to patient re: care coordination services Assessed social determinant of health barriers           If you are experiencing a Mental Health or [place] or need someone to talk to, please call the Suicide and Crisis Lifeline: 988 call the [place] National Suicide Prevention Lifeline: 9057378954 or TTY: (781) 192-8663 TTY 360-242-8865) to talk to a trained counselor call 1-800-273-TALK (toll free, 24 hour hotline) go to [facility] [address] [phone]) call 911   Patient verbalizes understanding of instructions and care plan provided today and agrees to view in [place]. Active MyChart status and patient understanding of how to access instructions and care plan via MyChart confirmed with patient.     No further follow up required:    Peter Garter RN, Jackquline [place], [facility] [phone]

## 2022-10-10 DIAGNOSIS — E782 Mixed hyperlipidemia: Secondary | ICD-10-CM | POA: Diagnosis not present

## 2022-10-10 DIAGNOSIS — I10 Essential (primary) hypertension: Secondary | ICD-10-CM | POA: Diagnosis not present

## 2022-10-10 DIAGNOSIS — E113392 Type 2 diabetes mellitus with moderate nonproliferative diabetic retinopathy without macular edema, left eye: Secondary | ICD-10-CM | POA: Diagnosis not present

## 2022-10-10 DIAGNOSIS — E1165 Type 2 diabetes mellitus with hyperglycemia: Secondary | ICD-10-CM | POA: Diagnosis not present

## 2022-10-17 DIAGNOSIS — H6692 Otitis media, unspecified, left ear: Secondary | ICD-10-CM | POA: Diagnosis not present

## 2022-10-17 DIAGNOSIS — E1165 Type 2 diabetes mellitus with hyperglycemia: Secondary | ICD-10-CM | POA: Diagnosis not present

## 2022-10-17 DIAGNOSIS — Z03818 Encounter for observation for suspected exposure to other biological agents ruled out: Secondary | ICD-10-CM | POA: Diagnosis not present

## 2022-10-17 DIAGNOSIS — R051 Acute cough: Secondary | ICD-10-CM | POA: Diagnosis not present

## 2022-10-29 DIAGNOSIS — G4733 Obstructive sleep apnea (adult) (pediatric): Secondary | ICD-10-CM | POA: Diagnosis not present

## 2022-11-05 DIAGNOSIS — Z6841 Body Mass Index (BMI) 40.0 and over, adult: Secondary | ICD-10-CM | POA: Diagnosis not present

## 2022-11-05 DIAGNOSIS — I10 Essential (primary) hypertension: Secondary | ICD-10-CM | POA: Diagnosis not present

## 2022-11-05 DIAGNOSIS — E1165 Type 2 diabetes mellitus with hyperglycemia: Secondary | ICD-10-CM | POA: Diagnosis not present

## 2022-11-05 DIAGNOSIS — Z794 Long term (current) use of insulin: Secondary | ICD-10-CM | POA: Diagnosis not present

## 2022-11-05 DIAGNOSIS — Z23 Encounter for immunization: Secondary | ICD-10-CM | POA: Diagnosis not present

## 2022-11-05 DIAGNOSIS — E1142 Type 2 diabetes mellitus with diabetic polyneuropathy: Secondary | ICD-10-CM | POA: Diagnosis not present

## 2022-11-05 DIAGNOSIS — E782 Mixed hyperlipidemia: Secondary | ICD-10-CM | POA: Diagnosis not present

## 2022-11-12 DIAGNOSIS — E1165 Type 2 diabetes mellitus with hyperglycemia: Secondary | ICD-10-CM | POA: Diagnosis not present

## 2022-11-12 DIAGNOSIS — Z808 Family history of malignant neoplasm of other organs or systems: Secondary | ICD-10-CM | POA: Diagnosis not present

## 2022-11-12 DIAGNOSIS — Z801 Family history of malignant neoplasm of trachea, bronchus and lung: Secondary | ICD-10-CM | POA: Diagnosis not present

## 2022-11-12 DIAGNOSIS — Z800 Family history of malignant neoplasm of digestive organs: Secondary | ICD-10-CM | POA: Diagnosis not present

## 2022-11-12 DIAGNOSIS — Z794 Long term (current) use of insulin: Secondary | ICD-10-CM | POA: Diagnosis not present

## 2022-11-12 DIAGNOSIS — E782 Mixed hyperlipidemia: Secondary | ICD-10-CM | POA: Diagnosis not present

## 2022-11-12 DIAGNOSIS — K760 Fatty (change of) liver, not elsewhere classified: Secondary | ICD-10-CM | POA: Diagnosis not present

## 2022-11-19 DIAGNOSIS — E1142 Type 2 diabetes mellitus with diabetic polyneuropathy: Secondary | ICD-10-CM | POA: Diagnosis not present

## 2022-11-19 DIAGNOSIS — E1165 Type 2 diabetes mellitus with hyperglycemia: Secondary | ICD-10-CM | POA: Diagnosis not present

## 2022-11-19 DIAGNOSIS — I10 Essential (primary) hypertension: Secondary | ICD-10-CM | POA: Diagnosis not present

## 2022-11-19 DIAGNOSIS — E782 Mixed hyperlipidemia: Secondary | ICD-10-CM | POA: Diagnosis not present

## 2022-11-26 DIAGNOSIS — E1142 Type 2 diabetes mellitus with diabetic polyneuropathy: Secondary | ICD-10-CM | POA: Diagnosis not present

## 2022-11-26 DIAGNOSIS — E1165 Type 2 diabetes mellitus with hyperglycemia: Secondary | ICD-10-CM | POA: Diagnosis not present

## 2022-12-10 DIAGNOSIS — Z0000 Encounter for general adult medical examination without abnormal findings: Secondary | ICD-10-CM | POA: Diagnosis not present

## 2022-12-10 DIAGNOSIS — Z6841 Body Mass Index (BMI) 40.0 and over, adult: Secondary | ICD-10-CM | POA: Diagnosis not present

## 2022-12-10 DIAGNOSIS — Z1389 Encounter for screening for other disorder: Secondary | ICD-10-CM | POA: Diagnosis not present

## 2022-12-19 IMAGING — CR DG FOOT COMPLETE 3+V*L*
3 series · 3 of 3 positions shown · non-contrast
Comparison: None.

CLINICAL DATA: Left foot pain.

EXAM:
LEFT FOOT - COMPLETE 3+ VIEW

[t foot ap left *]
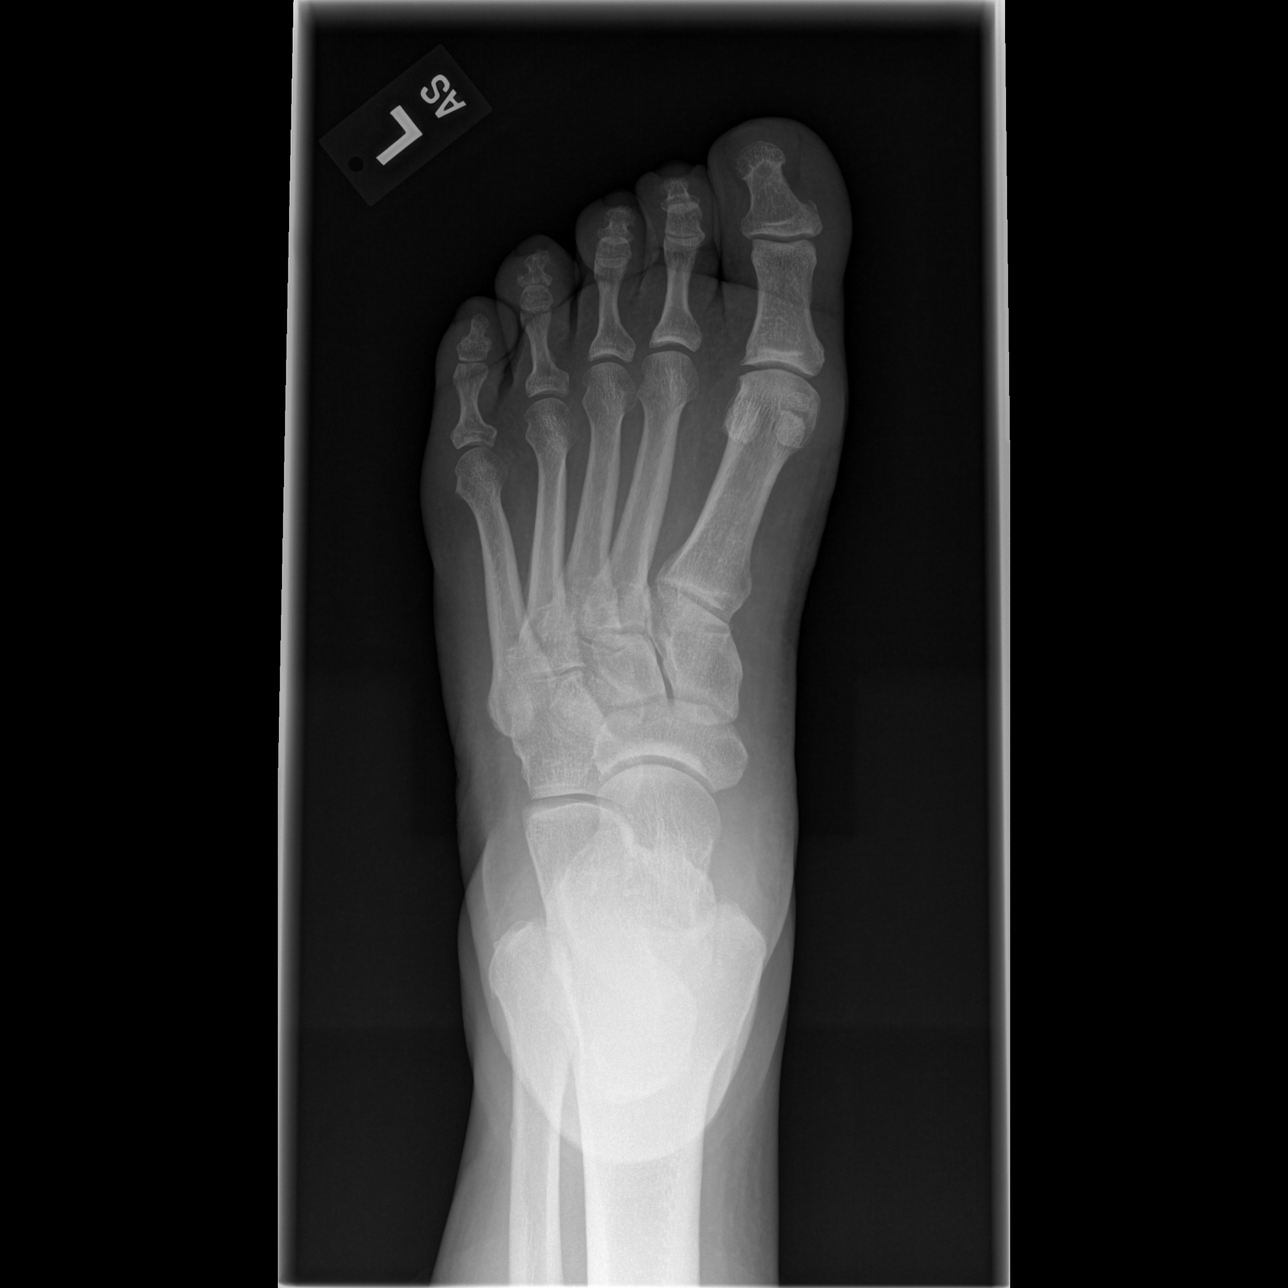

[t foot oblique left *]
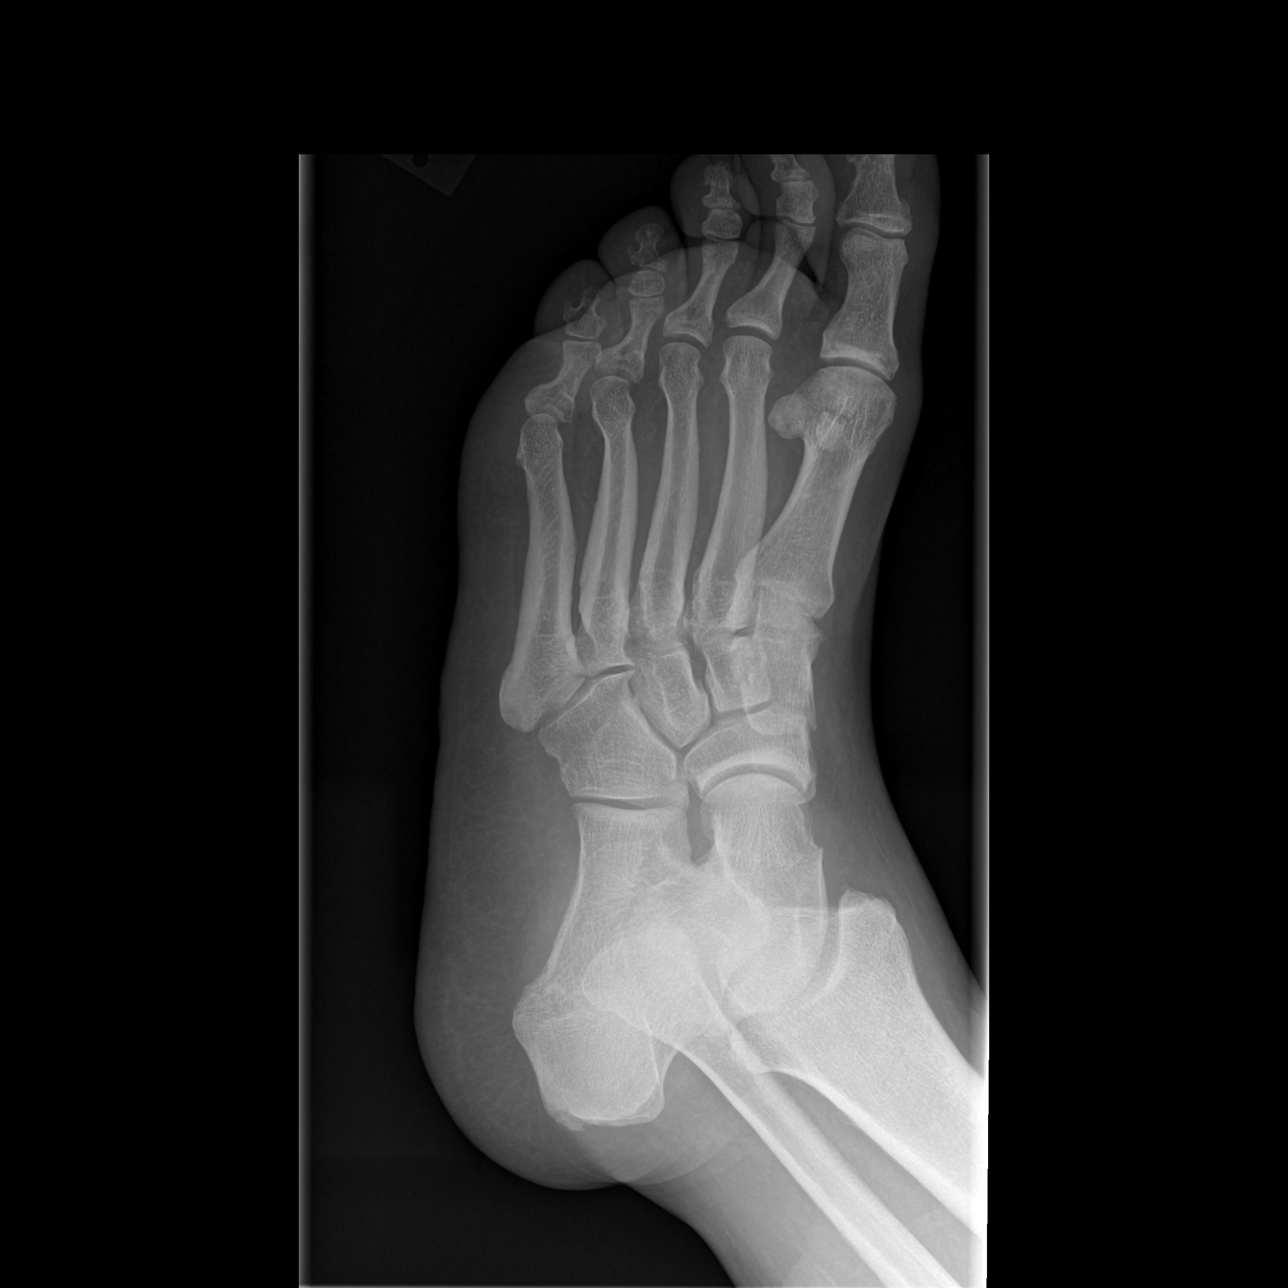

[t foot lat left *]
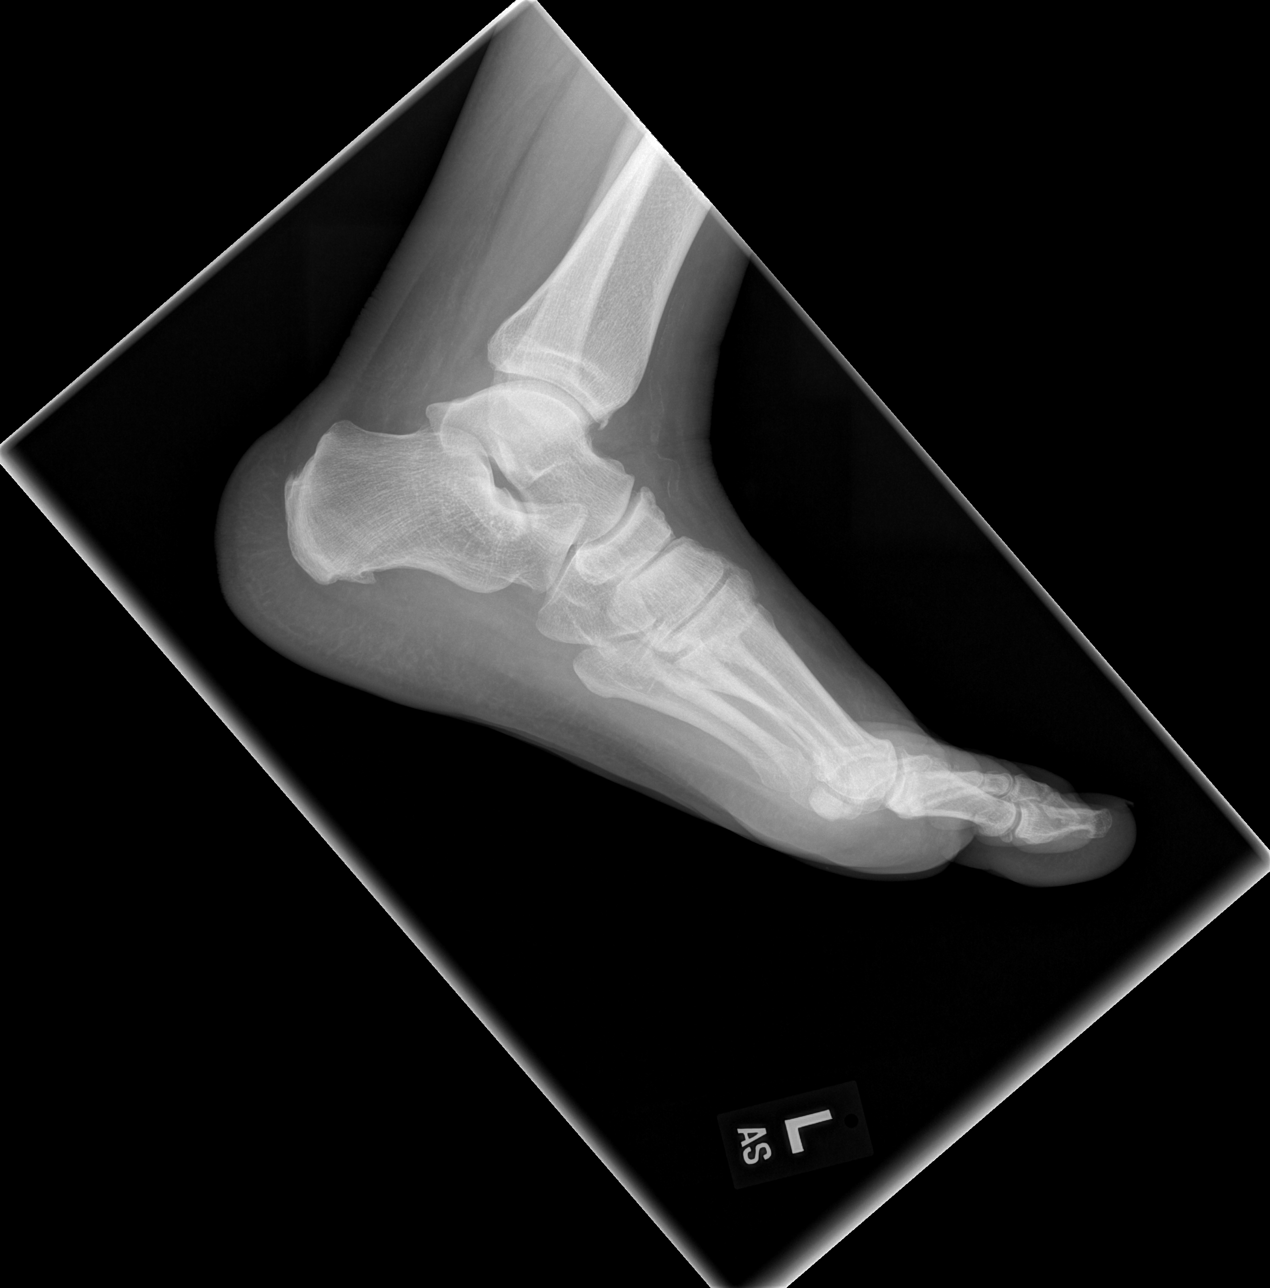

[3 of 3 positions shown; findings below may reference images not displayed]

FINDINGS: There is no evidence of fracture or dislocation. There is no
evidence of arthropathy or other focal bone abnormality. Peripheral
vascular calcifications are present. Soft tissues are otherwise
within normal limits.
IMPRESSION: Negative.

## 2023-01-11 DIAGNOSIS — R112 Nausea with vomiting, unspecified: Secondary | ICD-10-CM | POA: Diagnosis not present

## 2023-01-11 DIAGNOSIS — Z6841 Body Mass Index (BMI) 40.0 and over, adult: Secondary | ICD-10-CM | POA: Diagnosis not present

## 2023-01-11 DIAGNOSIS — R143 Flatulence: Secondary | ICD-10-CM | POA: Diagnosis not present

## 2023-01-28 DIAGNOSIS — G4733 Obstructive sleep apnea (adult) (pediatric): Secondary | ICD-10-CM | POA: Diagnosis not present

## 2023-02-13 DIAGNOSIS — R232 Flushing: Secondary | ICD-10-CM | POA: Diagnosis not present

## 2023-02-13 DIAGNOSIS — E782 Mixed hyperlipidemia: Secondary | ICD-10-CM | POA: Diagnosis not present

## 2023-02-13 DIAGNOSIS — Z794 Long term (current) use of insulin: Secondary | ICD-10-CM | POA: Diagnosis not present

## 2023-02-13 DIAGNOSIS — E1165 Type 2 diabetes mellitus with hyperglycemia: Secondary | ICD-10-CM | POA: Diagnosis not present

## 2023-02-13 DIAGNOSIS — K760 Fatty (change of) liver, not elsewhere classified: Secondary | ICD-10-CM | POA: Diagnosis not present

## 2023-02-13 DIAGNOSIS — Z6841 Body Mass Index (BMI) 40.0 and over, adult: Secondary | ICD-10-CM | POA: Diagnosis not present

## 2023-02-22 DIAGNOSIS — E1165 Type 2 diabetes mellitus with hyperglycemia: Secondary | ICD-10-CM | POA: Diagnosis not present

## 2023-02-22 DIAGNOSIS — I10 Essential (primary) hypertension: Secondary | ICD-10-CM | POA: Diagnosis not present

## 2023-02-22 DIAGNOSIS — E782 Mixed hyperlipidemia: Secondary | ICD-10-CM | POA: Diagnosis not present

## 2023-02-24 DIAGNOSIS — E1165 Type 2 diabetes mellitus with hyperglycemia: Secondary | ICD-10-CM | POA: Diagnosis not present

## 2023-02-24 DIAGNOSIS — E1142 Type 2 diabetes mellitus with diabetic polyneuropathy: Secondary | ICD-10-CM | POA: Diagnosis not present

## 2023-04-30 DIAGNOSIS — G4733 Obstructive sleep apnea (adult) (pediatric): Secondary | ICD-10-CM | POA: Diagnosis not present

## 2023-05-10 DIAGNOSIS — Z9989 Dependence on other enabling machines and devices: Secondary | ICD-10-CM | POA: Diagnosis not present

## 2023-05-10 DIAGNOSIS — I10 Essential (primary) hypertension: Secondary | ICD-10-CM | POA: Diagnosis not present

## 2023-05-10 DIAGNOSIS — Z6841 Body Mass Index (BMI) 40.0 and over, adult: Secondary | ICD-10-CM | POA: Diagnosis not present

## 2023-05-10 DIAGNOSIS — E113392 Type 2 diabetes mellitus with moderate nonproliferative diabetic retinopathy without macular edema, left eye: Secondary | ICD-10-CM | POA: Diagnosis not present

## 2023-05-10 DIAGNOSIS — Z794 Long term (current) use of insulin: Secondary | ICD-10-CM | POA: Diagnosis not present

## 2023-05-10 DIAGNOSIS — G4733 Obstructive sleep apnea (adult) (pediatric): Secondary | ICD-10-CM | POA: Diagnosis not present

## 2023-05-10 DIAGNOSIS — E1165 Type 2 diabetes mellitus with hyperglycemia: Secondary | ICD-10-CM | POA: Diagnosis not present

## 2023-05-10 DIAGNOSIS — E782 Mixed hyperlipidemia: Secondary | ICD-10-CM | POA: Diagnosis not present

## 2023-05-17 DIAGNOSIS — Z794 Long term (current) use of insulin: Secondary | ICD-10-CM | POA: Diagnosis not present

## 2023-05-17 DIAGNOSIS — R635 Abnormal weight gain: Secondary | ICD-10-CM | POA: Diagnosis not present

## 2023-05-17 DIAGNOSIS — E1165 Type 2 diabetes mellitus with hyperglycemia: Secondary | ICD-10-CM | POA: Diagnosis not present

## 2023-05-17 DIAGNOSIS — E782 Mixed hyperlipidemia: Secondary | ICD-10-CM | POA: Diagnosis not present

## 2023-05-17 DIAGNOSIS — K760 Fatty (change of) liver, not elsewhere classified: Secondary | ICD-10-CM | POA: Diagnosis not present

## 2023-05-23 DIAGNOSIS — E11319 Type 2 diabetes mellitus with unspecified diabetic retinopathy without macular edema: Secondary | ICD-10-CM | POA: Diagnosis not present

## 2023-05-23 DIAGNOSIS — H524 Presbyopia: Secondary | ICD-10-CM | POA: Diagnosis not present

## 2023-06-26 DIAGNOSIS — R55 Syncope and collapse: Secondary | ICD-10-CM | POA: Diagnosis not present

## 2023-06-26 DIAGNOSIS — J45909 Unspecified asthma, uncomplicated: Secondary | ICD-10-CM | POA: Diagnosis not present

## 2023-07-23 DIAGNOSIS — E1165 Type 2 diabetes mellitus with hyperglycemia: Secondary | ICD-10-CM | POA: Diagnosis not present

## 2023-07-23 DIAGNOSIS — I10 Essential (primary) hypertension: Secondary | ICD-10-CM | POA: Diagnosis not present

## 2023-07-23 DIAGNOSIS — G4733 Obstructive sleep apnea (adult) (pediatric): Secondary | ICD-10-CM | POA: Diagnosis not present

## 2023-08-19 DIAGNOSIS — Z794 Long term (current) use of insulin: Secondary | ICD-10-CM | POA: Diagnosis not present

## 2023-08-19 DIAGNOSIS — K760 Fatty (change of) liver, not elsewhere classified: Secondary | ICD-10-CM | POA: Diagnosis not present

## 2023-08-19 DIAGNOSIS — E1165 Type 2 diabetes mellitus with hyperglycemia: Secondary | ICD-10-CM | POA: Diagnosis not present

## 2023-08-19 DIAGNOSIS — E782 Mixed hyperlipidemia: Secondary | ICD-10-CM | POA: Diagnosis not present

## 2023-08-28 ENCOUNTER — Ambulatory Visit: Payer: Medicare HMO

## 2023-08-28 ENCOUNTER — Encounter: Payer: Self-pay | Admitting: Pulmonary Disease

## 2023-08-28 ENCOUNTER — Ambulatory Visit: Payer: Medicare HMO | Admitting: Pulmonary Disease

## 2023-08-28 VITALS — BP 114/64 | HR 82 | Temp 99.0°F | Ht 70.0 in | Wt 316.2 lb

## 2023-08-28 DIAGNOSIS — R059 Cough, unspecified: Secondary | ICD-10-CM | POA: Diagnosis not present

## 2023-08-28 DIAGNOSIS — R0989 Other specified symptoms and signs involving the circulatory and respiratory systems: Secondary | ICD-10-CM | POA: Diagnosis not present

## 2023-08-28 DIAGNOSIS — R051 Acute cough: Secondary | ICD-10-CM

## 2023-08-28 DIAGNOSIS — R053 Chronic cough: Secondary | ICD-10-CM | POA: Diagnosis not present

## 2023-08-28 MED ORDER — AZITHROMYCIN 250 MG PO TABS: ORAL_TABLET | ORAL | 0 refills | Status: AC

## 2023-08-28 MED ORDER — BUDESONIDE-FORMOTEROL FUMARATE 160-4.5 MCG/ACT IN AERO: 2.0000 | INHALATION_SPRAY | Freq: Two times a day (BID) | RESPIRATORY_TRACT | 12 refills | Status: AC

## 2023-08-28 MED ORDER — PREDNISONE 20 MG PO TABS: ORAL_TABLET | ORAL | 0 refills | Status: AC

## 2023-08-28 NOTE — Patient Instructions (Signed)
For the worsening cough take prednisone 40 mg for 5 days and 20 mg for 5 days then stop  Take azithromycin as prescribed, 2 tablets on day 1 and 1 tablet on days 2 through 5  We will increase the strength of the Symbicort, stop your current strength.  New prescription for high-dose Symbicort 2 puffs twice a day every day.  Rinse her mouth with water after every use.  Will get a chest x-ray today.  Return to clinic in 3 months or sooner as needed with Dr. Judeth Horn

## 2023-08-28 NOTE — Progress Notes (Signed)
@Patient  ID: Vincent Mcintyre, male    DOB: 11/05/1965, 57 y.o.   MRN: [id number]  Chief Complaint  Patient presents with   Follow-up    Coughed so hard 2 months ago that he passed out and had blood coming out of his mouth.  He saw his PCP, they said it could be from the coughing.    Referring provider: Jarrett Soho, PA-C  HPI:   57 y.o. man whom we are seeing in follow up for evaluation of chronic cough as well as dyspnea on exertion/shortness of breath.    Overdue for follow-up.  Lost to follow-up.  Last seen 18+ months ago. Overdue for 1 year.  Worsening cough since RSV infection last year.  In the interim since last visit.  Comes and goes.  Episode of posttussive syncope 2 months ago.  He clear describes the sensation of liquids would be liquor or vinegar with vapor like properties causing acute symptoms.  Like coughing choking.  Last a few seconds.  For the last week or so worsening cough throughout the day.  Congestion.  Chest tightness or cold sensation in his chest.  HPI at initial visit: Patient was in normal state of health.  Started on CPAP therapy for diagnosis of obstructive sleep apnea in the fall 2022.  Since then he reports cough at night.  He reports good hygiene to his CPAP machine.  Cleans it regularly.  No issues.  Orders and changes new supplies at recommended intervals.  Cough worsening lies down.  At night.  Present in the morning.  Gradually improved to the day.  May be worse when lying supine.  No other environmental or seasonal factors he can identify to make things better or worse.  Was put on Symbicort 09/2020.  He is using this at night.  Sometimes in the mornings.  Not twice a day.  Has helped a little bit in terms of severity/frequency of cough.  In addition, he reports shortness of breath.  Again worse in the evenings, worse when lying supine.  Does not take long to recover per his report.  He reports good adherence to CPAP.  The shortness of breath makes it  challenging, when he gets short of breath sometimes he will take the CPAP off for the night.  Reviewed most recent chest imaging 06/2018 that shows hyperinflation on the lateral view, otherwise clear lungs on my interpretation.  No history of TTE that I can review.  PMH: Hypertension, diabetes Surgical history: I&D of perirectal abscess Family history:History reviewed. No pertinent family history. Social history: Never smoker, lives in [place] / Pulmonary Flowsheets:   ACT:      No data to display          MMRC:     No data to display          Epworth:      No data to display          Tests:   FENO:  No results found for: "NITRICOXIDE"  PFT:     No data to display          WALK:      No data to display          Imaging: No results found. Personally reviewed and as per EMR and discussion in this note Lab Results: Personally reviewed CBC    Component Value Date/Time   WBC 4.4 10/03/2020 1904   RBC 5.80 10/03/2020 1904   HGB 16.7 10/03/2020  1904   HCT 48.8 10/03/2020 1904   PLT 171 10/03/2020 1904   MCV 84.1 10/03/2020 1904   MCH 28.8 10/03/2020 1904   MCHC 34.2 10/03/2020 1904   RDW 13.0 10/03/2020 1904   LYMPHSABS 1.9 11/25/2014 1932   MONOABS 1.0 11/25/2014 1932   EOSABS 0.0 11/25/2014 1932   BASOSABS 0.0 11/25/2014 1932    BMET    Component Value Date/Time   NA 139 10/03/2020 1904   K 3.5 10/03/2020 1904   CL 101 10/03/2020 1904   CO2 25 10/03/2020 1904   GLUCOSE 143 (H) 10/03/2020 1904   BUN 11 10/03/2020 1904   CREATININE 0.81 10/03/2020 1904   CALCIUM 8.5 (L) 10/03/2020 1904   GFRNONAA >60 10/03/2020 1904   GFRAA 86 (L) 12/07/2014 1749    BNP No results found for: "BNP"  ProBNP No results found for: "PROBNP"  Specialty Problems   None   No Known Allergies  Immunization History  Administered Date(s) Administered   Influenza,inj,Quad PF,6+ Mos 11/30/2014   Pneumococcal Polysaccharide-23  11/30/2014    Past Medical History:  Diagnosis Date   Diabetes mellitus     Dr. Juluis Rainier - [facility] PCP   Hyperlipidemia    Hypertension     Tobacco History: Social History   Tobacco Use  Smoking Status Never  Smokeless Tobacco Never   Counseling given: Not Answered   Continue to not smoke  Outpatient Encounter Medications as of 08/28/2023  Medication Sig   amLODipine-olmesartan (AZOR) 5-40 MG tablet Take 1 tablet by mouth daily.   atorvastatin (LIPITOR) 40 MG tablet atorvastatin 40 mg tablet  TAKE 1 TABLET BY MOUTH EVERY DAY   azithromycin (ZITHROMAX) 250 MG tablet Take 2 tablets (500 mg total) by mouth daily for 1 day, THEN 1 tablet (250 mg total) daily for 4 days.   budesonide-formoterol (SYMBICORT) 160-4.5 MCG/ACT inhaler Inhale 2 puffs into the lungs 2 (two) times daily.   HYDROcodone-acetaminophen (NORCO/VICODIN) 5-325 MG tablet 2 to 3 tablets as needed   insulin lispro (HUMALOG) 100 UNIT/ML KwikPen Humalog KwikPen (U-100) Insulin 100 unit/mL subcutaneous  10 UNITS OR AS DIRECTED AT BEDTIME SUBCUTANEOUS 90   Insulin Pen Needle 29G X MISC Use with insulin pens as directed   naproxen (NAPROSYN) 375 MG tablet Take 750 mg by mouth 2 (two) times daily as needed for moderate pain (takes with hydrocodone).    predniSONE (DELTASONE) 20 MG tablet Take 2 tablets (40 mg total) by mouth daily with breakfast for 5 days, THEN 1 tablet (20 mg total) daily with breakfast for 5 days.   sildenafil (REVATIO) 20 MG tablet Up to 5 pills   [DISCONTINUED] budesonide-formoterol (SYMBICORT) 80-4.5 MCG/ACT inhaler Inhale 2 puffs into the lungs 2 (two) times daily.   [DISCONTINUED] metFORMIN (GLUCOPHAGE) 500 MG tablet Take 1,000 mg by mouth 2 (two) times daily with a meal. (Patient not taking: Reported on 08/28/2023)   No facility-administered encounter medications on file as of 08/28/2023.     Review of Systems  Review of Systems  N/a  Physical Exam  BP 114/64 (BP Location:  Left Arm, Patient Position: Sitting, Cuff Size: Large)   Pulse 82   Temp 99 F (37.2 C) (Oral)   Ht 5\' 10"  (1.778 m)   Wt (!) 316 lb 3.2 oz (143.4 kg)   SpO2 93%   BMI 45.37 kg/m   Wt Readings from Last 5 Encounters:  08/28/23 (!) 316 lb 3.2 oz (143.4 kg)  02/06/22 (!) 308 lb 12.8 oz (140.1  kg)  12/25/21 (!) 310 lb 4.8 oz (140.8 kg)  10/03/20 291 lb 4.8 oz (132.1 kg)  05/09/15 267 lb (121.1 kg)    BMI Readings from Last 5 Encounters:  08/28/23 45.37 kg/m  02/06/22 44.31 kg/m  12/25/21 44.52 kg/m  10/03/20 41.80 kg/m  05/09/15 38.31 kg/m     Physical Exam General: Well-appearing, no acute distress Eyes: EOMI, intact Neck: Supple, no JVP appreciated sitting upright, habitus does make total evaluation difficult Pulmonary: Clear, distant Cardiovascular: Regular rate and rhythm, no murmur appreciated Abdomen: Nondistended, bowel sounds present MSK: No synovitis, no joint effusion Neuro: Normal gait, no weakness Psych: Normal mood, full affect   Assessment & Plan:   Chronic cough: Worse at night. Denies GERD symptoms. Cough variant asthma possible.  Historically improved with low-dose Symbicort.  Worse after RSV infection.  Suspect cough variant asthma.  Escalate Symbicort to high-dose.  Chest x-ray today.  Antibiotics, prednisone taper and azithromycin as well given concern for developing bronchitis, low-grade temperature.  Shortness of breath: Overall seems stable.  Increasing Symbicort above.   Return in about 3 months (around 11/26/2023) for f/u Dr. Judeth Horn.   Karren Burly, MD 08/28/2023

## 2023-09-12 ENCOUNTER — Telehealth: Payer: Self-pay | Admitting: Pulmonary Disease

## 2023-09-12 NOTE — Telephone Encounter (Signed)
 Please call with rad results.

## 2023-09-13 NOTE — Telephone Encounter (Signed)
 Called and spoke with patient, he states that someone called him to set up a follow up and he asked if the results were back.  I advised him that the results were not back and there is a delay 1-2 weeks in us  getting the results and we will call him when we have them.  He verbalized understanding.  Nothing further needed.

## 2023-11-20 ENCOUNTER — Ambulatory Visit: Payer: Medicare HMO | Admitting: Pulmonary Disease

## 2023-11-20 ENCOUNTER — Encounter: Payer: Self-pay | Admitting: Pulmonary Disease

## 2023-11-20 VITALS — BP 132/76 | HR 82 | Temp 98.2°F | Ht 70.0 in | Wt 317.4 lb

## 2023-11-20 DIAGNOSIS — R059 Cough, unspecified: Secondary | ICD-10-CM | POA: Diagnosis not present

## 2023-11-20 DIAGNOSIS — R0609 Other forms of dyspnea: Secondary | ICD-10-CM

## 2023-11-20 MED ORDER — BREZTRI AEROSPHERE 160-9-4.8 MCG/ACT IN AERO
2.0000 | INHALATION_SPRAY | Freq: Two times a day (BID) | RESPIRATORY_TRACT | Status: AC
Start: 2023-11-20 — End: ?

## 2023-11-20 NOTE — Patient Instructions (Signed)
 Nice to see you again  Since the Symbicort helped a little bit I think is reasonable to keep trying inhalers, sound like bronchospasm.  Use Breztri 2 puffs twice a day, rinse your mouth out after every use.  Breztri will replace Symbicort, stop Symbicort once you start Breztri.  If this helps her next couple of weeks and me a message I can prescribe this long-term.  If not we can stick with the Symbicort.  I sent a referral to the heart doctors given the episode while you are cutting up a tree, just to be on the safe side  Return to clinic in 3 months or sooner as needed with Dr. Judeth Horn

## 2023-11-20 NOTE — Progress Notes (Signed)
 @Patient  ID: Vincent Mcintyre, male    DOB: 1966/04/05, 58 y.o.   MRN: [id number]  Chief Complaint  Patient presents with   Follow-up    SOB and choking some improvement    Referring provider: Jarrett Soho, PA-C  HPI:   58 y.o. man whom we are seeing in follow up for evaluation of chronic cough as well as dyspnea on exertion/shortness of breath.  Multiple telephone encounters reviewed.  At last visit, cough is worse.  Sounds like coughing fits.  Escalated low-dose Symbicort to high-dose.  Some improvement since then.  No posttussive syncope or presyncope which seem to be a concern prior.  He still has coughing fits.  Comes out of the blue.  Like he is choking on air.  Cannot get a deep breath in or out.  Cough for a while that improves.  Complicated to have some choking issues with swallowing and eating occasionally about once a month.  He describes a significant issue with a lot of dyspnea with exertion lymphadenopathy recently.  Out of the normal for him.  HPI at initial visit: Patient was in normal state of health.  Started on CPAP therapy for diagnosis of obstructive sleep apnea in the fall 2022.  Since then he reports cough at night.  He reports good hygiene to his CPAP machine.  Cleans it regularly.  No issues.  Orders and changes new supplies at recommended intervals.  Cough worsening lies down.  At night.  Present in the morning.  Gradually improved to the day.  May be worse when lying supine.  No other environmental or seasonal factors he can identify to make things better or worse.  Was put on Symbicort 09/2020.  He is using this at night.  Sometimes in the mornings.  Not twice a day.  Has helped a little bit in terms of severity/frequency of cough.  In addition, he reports shortness of breath.  Again worse in the evenings, worse when lying supine.  Does not take long to recover per his report.  He reports good adherence to CPAP.  The shortness of breath makes it challenging, when he  gets short of breath sometimes he will take the CPAP off for the night.  Reviewed most recent chest imaging 06/2018 that shows hyperinflation on the lateral view, otherwise clear lungs on my interpretation.  No history of TTE that I can review.  PMH: Hypertension, diabetes Surgical history: I&D of perirectal abscess Family history:History reviewed. No pertinent family history. Social history: Never smoker, lives in [place] / Pulmonary Flowsheets:   ACT:      No data to display          MMRC:     No data to display          Epworth:      No data to display          Tests:   FENO:  No results found for: "NITRICOXIDE"  PFT:     No data to display          WALK:      No data to display          Imaging: No results found. Personally reviewed and as per EMR and discussion in this note Lab Results: Personally reviewed CBC    Component Value Date/Time   WBC 4.4 10/03/2020 1904   RBC 5.80 10/03/2020 1904   HGB 16.7 10/03/2020 1904   HCT 48.8 10/03/2020 1904   PLT 171  10/03/2020 1904   MCV 84.1 10/03/2020 1904   MCH 28.8 10/03/2020 1904   MCHC 34.2 10/03/2020 1904   RDW 13.0 10/03/2020 1904   LYMPHSABS 1.9 11/25/2014 1932   MONOABS 1.0 11/25/2014 1932   EOSABS 0.0 11/25/2014 1932   BASOSABS 0.0 11/25/2014 1932    BMET    Component Value Date/Time   NA 139 10/03/2020 1904   K 3.5 10/03/2020 1904   CL 101 10/03/2020 1904   CO2 25 10/03/2020 1904   GLUCOSE 143 (H) 10/03/2020 1904   BUN 11 10/03/2020 1904   CREATININE 0.81 10/03/2020 1904   CALCIUM 8.5 (L) 10/03/2020 1904   GFRNONAA >60 10/03/2020 1904   GFRAA 86 (L) 12/07/2014 1749    BNP No results found for: "BNP"  ProBNP No results found for: "PROBNP"  Specialty Problems   None   Allergies  Allergen Reactions   Semaglutide Nausea And Vomiting    Immunization History  Administered Date(s) Administered   Influenza,inj,Quad PF,6+ Mos 11/30/2014    Pneumococcal Polysaccharide-23 11/30/2014    Past Medical History:  Diagnosis Date   Diabetes mellitus     Dr. Juluis Rainier Deboraha Sprang PCP   Hyperlipidemia    Hypertension     Tobacco History: Social History   Tobacco Use  Smoking Status Never  Smokeless Tobacco Never   Counseling given: Not Answered   Continue to not smoke  Outpatient Encounter Medications as of 11/20/2023  Medication Sig   amLODipine-olmesartan (AZOR) 5-40 MG tablet Take 1 tablet by mouth daily.   atorvastatin (LIPITOR) 40 MG tablet atorvastatin 40 mg tablet  TAKE 1 TABLET BY MOUTH EVERY DAY   budeson-glycopyrrolate-formoterol (BREZTRI AEROSPHERE) 160-9-4.8 MCG/ACT AERO Inhale 2 puffs into the lungs in the morning and at bedtime.   budesonide-formoterol (SYMBICORT) 160-4.5 MCG/ACT inhaler Inhale 2 puffs into the lungs 2 (two) times daily.   Empagliflozin (JARDIANCE PO) Take 1 tablet by mouth daily.   HYDROcodone-acetaminophen (NORCO/VICODIN) 5-325 MG tablet 2 to 3 tablets as needed   insulin lispro (HUMALOG) 100 UNIT/ML KwikPen Humalog KwikPen (U-100) Insulin 100 unit/mL subcutaneous  10 UNITS OR AS DIRECTED AT BEDTIME SUBCUTANEOUS 90   Insulin Pen Needle 29G X MISC Use with insulin pens as directed   naproxen (NAPROSYN) 375 MG tablet Take 750 mg by mouth 2 (two) times daily as needed for moderate pain (takes with hydrocodone).    sildenafil (REVATIO) 20 MG tablet Up to 5 pills   No facility-administered encounter medications on file as of 11/20/2023.     Review of Systems  Review of Systems  N/a  Physical Exam  BP 132/76 (BP Location: Left Arm, Patient Position: Sitting, Cuff Size: Large)   Pulse 82   Temp 98.2 F (36.8 C) (Oral)   Ht 5\' 10"  (1.778 m)   Wt (!) 317 lb 6.4 oz (144 kg)   SpO2 95%   BMI 45.54 kg/m   Wt Readings from Last 5 Encounters:  11/20/23 (!) 317 lb 6.4 oz (144 kg)  08/28/23 (!) 316 lb 3.2 oz (143.4 kg)  02/06/22 (!) 308 lb 12.8 oz (140.1 kg)  12/25/21 (!) 310 lb  4.8 oz (140.8 kg)  10/03/20 291 lb 4.8 oz (132.1 kg)    BMI Readings from Last 5 Encounters:  11/20/23 45.54 kg/m  08/28/23 45.37 kg/m  02/06/22 44.31 kg/m  12/25/21 44.52 kg/m  10/03/20 41.80 kg/m     Physical Exam General: Well-appearing, no acute distress Eyes: EOMI, intact Neck: Supple, no JVP appreciated sitting upright,  habitus does make total evaluation difficult Pulmonary: Clear, distant Cardiovascular: Regular rate and rhythm, no murmur appreciated Abdomen: Nondistended, bowel sounds present MSK: No synovitis, no joint effusion Neuro: Normal gait, no weakness Psych: Normal mood, full affect   Assessment & Plan:   Chronic cough: Worse at night. Denies GERD symptoms. Cough variant asthma possible.  Historically improved with low-dose Symbicort.  Worse after RSV infection.  Suspect cough variant asthma.  Chest x-ray last visit 12/24 clear.  Escalate Symbicort to high-dose with mild improvement.  Further escalate to [facility] (via samples, if helpful can prescribe long-term) to see if additional bronchodilator will reduce with sounds like bronchospasm contributing to cough.  Shortness of breath: Overall seems stable although describes a significant episode with extreme exertion cutting up wood, chopping a fallen tree.  He has risk factors for coronary disease.  Escalate inhalers as above.  Referral to cardiology.   Return in about 3 months (around 02/20/2024) for f/u Dr. Judeth Horn.   Karren Burly, MD 11/20/2023

## 2023-11-25 DIAGNOSIS — E782 Mixed hyperlipidemia: Secondary | ICD-10-CM | POA: Diagnosis not present

## 2023-11-25 DIAGNOSIS — I10 Essential (primary) hypertension: Secondary | ICD-10-CM | POA: Diagnosis not present

## 2023-11-29 ENCOUNTER — Telehealth: Payer: Self-pay | Admitting: Pulmonary Disease

## 2023-11-29 NOTE — Telephone Encounter (Signed)
 Called and spoke to patient. He stated that after he used [place], he developed a dry cough and felt that he had a chest cold.  He stopped Breztri on Wednesday and all sx have subsided.  He is questioning if he should resume Symbicort.   Dr. Judeth Horn is unavailable. Tammy, please advise. Thanks

## 2023-11-29 NOTE — Telephone Encounter (Signed)
 Patient is taking the Breztri but when he takes it it feels like he's getting a chest old. He would like a call back to advise on if he should continue taking it [phone]

## 2023-11-29 NOTE — Telephone Encounter (Signed)
 Yes would restart Symbicort 2 puffs Twice daily . Will send to Dr. Judeth Horn for FYI .

## 2023-11-29 NOTE — Telephone Encounter (Signed)
 I called and spoke with the pt and notified of response per Tammy  Pt verbalized understanding  Nothing further needed

## 2023-12-04 ENCOUNTER — Telehealth: Payer: Self-pay | Admitting: Pulmonary Disease

## 2023-12-04 NOTE — Telephone Encounter (Signed)
 Noted.  I have placed samples in sample closet. Samples are unopened and unused.

## 2023-12-04 NOTE — Telephone Encounter (Signed)
 PT presents to the front to return Breztri samples. He said he can not use them. He said a Nurse told him to return them. I will put them in the sample cabinet with Margie's name on them.

## 2023-12-11 DIAGNOSIS — Z23 Encounter for immunization: Secondary | ICD-10-CM | POA: Diagnosis not present

## 2023-12-11 DIAGNOSIS — Z1331 Encounter for screening for depression: Secondary | ICD-10-CM | POA: Diagnosis not present

## 2023-12-11 DIAGNOSIS — Z6841 Body Mass Index (BMI) 40.0 and over, adult: Secondary | ICD-10-CM | POA: Diagnosis not present

## 2023-12-11 DIAGNOSIS — Z0000 Encounter for general adult medical examination without abnormal findings: Secondary | ICD-10-CM | POA: Diagnosis not present

## 2023-12-30 DIAGNOSIS — G4733 Obstructive sleep apnea (adult) (pediatric): Secondary | ICD-10-CM | POA: Diagnosis not present

## 2023-12-30 DIAGNOSIS — F5104 Psychophysiologic insomnia: Secondary | ICD-10-CM | POA: Diagnosis not present

## 2024-01-21 DIAGNOSIS — G4733 Obstructive sleep apnea (adult) (pediatric): Secondary | ICD-10-CM | POA: Diagnosis not present

## 2024-02-07 ENCOUNTER — Ambulatory Visit: Admitting: Pulmonary Disease

## 2024-02-07 ENCOUNTER — Encounter: Payer: Self-pay | Admitting: Pulmonary Disease

## 2024-02-07 VITALS — BP 130/74 | HR 82 | Ht 70.0 in | Wt 315.0 lb

## 2024-02-07 DIAGNOSIS — J4540 Moderate persistent asthma, uncomplicated: Secondary | ICD-10-CM | POA: Diagnosis not present

## 2024-02-07 DIAGNOSIS — R0602 Shortness of breath: Secondary | ICD-10-CM | POA: Diagnosis not present

## 2024-02-07 DIAGNOSIS — R053 Chronic cough: Secondary | ICD-10-CM | POA: Diagnosis not present

## 2024-02-07 LAB — CBC WITH DIFFERENTIAL/PLATELET
Basophils Absolute: 0.1 10*3/uL (ref 0.0–0.1)
Basophils Relative: 0.7 % (ref 0.0–3.0)
Eosinophils Absolute: 0.1 10*3/uL (ref 0.0–0.7)
Eosinophils Relative: 1.8 % (ref 0.0–5.0)
HCT: 45.3 % (ref 39.0–52.0)
Hemoglobin: 15.4 g/dL (ref 13.0–17.0)
Lymphocytes Relative: 18.7 % (ref 12.0–46.0)
Lymphs Abs: 1.4 10*3/uL (ref 0.7–4.0)
MCHC: 34.1 g/dL (ref 30.0–36.0)
MCV: 83.2 fl (ref 78.0–100.0)
Monocytes Absolute: 0.7 10*3/uL (ref 0.1–1.0)
Monocytes Relative: 9.1 % (ref 3.0–12.0)
Neutro Abs: 5.2 10*3/uL (ref 1.4–7.7)
Neutrophils Relative %: 69.7 % (ref 43.0–77.0)
Platelets: 279 10*3/uL (ref 150.0–400.0)
RBC: 5.44 Mil/uL (ref 4.22–5.81)
RDW: 14.8 % (ref 11.5–15.5)
WBC: 7.5 10*3/uL (ref 4.0–10.5)

## 2024-02-07 NOTE — Progress Notes (Signed)
 @Patient  ID: Vincent Mcintyre, male    DOB: November 08, 1965, 58 y.o.   MRN: [id number]  Chief Complaint  Patient presents with   Follow-up    Patient states his breathing is the same as last visit.    Referring provider: Darnelle Elders, PA-C  HPI:   58 y.o. man whom we are seeing in follow up for evaluation of chronic cough as well as dyspnea on exertion/shortness of breath.  Multiple telephone encounters reviewed.  Overall doing okay.  Symptoms largely unchanged.  Cough seems a bit better.  Good adherence to Symbicort .  We tried Breztri  at last visit but seem to make the cough and congestion worse.  This was stopped.  Occasional episodes of chest tightness.  Usually at rest.  Upcoming cardiology evaluation.  We discussed the role rationale for biologic therapy in the future.  Role and rationale for lab work today.  HPI at initial visit: Patient was in normal state of health.  Started on CPAP therapy for diagnosis of obstructive sleep apnea in the fall 2022.  Since then he reports cough at night.  He reports good hygiene to his CPAP machine.  Cleans it regularly.  No issues.  Orders and changes new supplies at recommended intervals.  Cough worsening lies down.  At night.  Present in the morning.  Gradually improved to the day.  May be worse when lying supine.  No other environmental or seasonal factors he can identify to make things better or worse.  Was put on Symbicort  09/2020.  He is using this at night.  Sometimes in the mornings.  Not twice a day.  Has helped a little bit in terms of severity/frequency of cough.  In addition, he reports shortness of breath.  Again worse in the evenings, worse when lying supine.  Does not take long to recover per his report.  He reports good adherence to CPAP.  The shortness of breath makes it challenging, when he gets short of breath sometimes he will take the CPAP off for the night.  Reviewed most recent chest imaging 06/2018 that shows hyperinflation on the  lateral view, otherwise clear lungs on my interpretation.  No history of TTE that I can review.  PMH: Hypertension, diabetes Surgical history: I&D of perirectal abscess Family history:History reviewed. No pertinent family history. Social history: Never smoker, lives in [place] / Pulmonary Flowsheets:   ACT:      No data to display          MMRC:     No data to display          Epworth:      No data to display          Tests:   FENO:  No results found for: "NITRICOXIDE"  PFT:     No data to display          WALK:      No data to display          Imaging: No results found. Personally reviewed and as per EMR and discussion in this note Lab Results: Personally reviewed CBC    Component Value Date/Time   WBC 4.4 10/03/2020 1904   RBC 5.80 10/03/2020 1904   HGB 16.7 10/03/2020 1904   HCT 48.8 10/03/2020 1904   PLT 171 10/03/2020 1904   MCV 84.1 10/03/2020 1904   MCH 28.8 10/03/2020 1904   MCHC 34.2 10/03/2020 1904   RDW 13.0 10/03/2020 1904   LYMPHSABS 1.9 11/25/2014  1932   MONOABS 1.0 11/25/2014 1932   EOSABS 0.0 11/25/2014 1932   BASOSABS 0.0 11/25/2014 1932    BMET    Component Value Date/Time   NA 139 10/03/2020 1904   K 3.5 10/03/2020 1904   CL 101 10/03/2020 1904   CO2 25 10/03/2020 1904   GLUCOSE 143 (H) 10/03/2020 1904   BUN 11 10/03/2020 1904   CREATININE 0.81 10/03/2020 1904   CALCIUM 8.5 (L) 10/03/2020 1904   GFRNONAA >60 10/03/2020 1904   GFRAA 86 (L) 12/07/2014 1749    BNP No results found for: "BNP"  ProBNP No results found for: "PROBNP"  Specialty Problems   None   Allergies  Allergen Reactions   Semaglutide Nausea And Vomiting    Immunization History  Administered Date(s) Administered   Influenza,inj,Quad PF,6+ Mos 11/30/2014   Pneumococcal Polysaccharide-23 11/30/2014    Past Medical History:  Diagnosis Date   Diabetes mellitus     Dr. Clydia Dart Cherene Core PCP    Hyperlipidemia    Hypertension     Tobacco History: Social History   Tobacco Use  Smoking Status Never  Smokeless Tobacco Never   Counseling given: Not Answered   Continue to not smoke  Outpatient Encounter Medications as of 02/07/2024  Medication Sig   amLODipine-olmesartan (AZOR) 5-40 MG tablet Take 1 tablet by mouth daily.   atorvastatin (LIPITOR) 40 MG tablet atorvastatin 40 mg tablet  TAKE 1 TABLET BY MOUTH EVERY DAY   budeson-glycopyrrolate -formoterol  (BREZTRI  AEROSPHERE) 160-9-4.8 MCG/ACT AERO Inhale 2 puffs into the lungs in the morning and at bedtime.   budesonide -formoterol  (SYMBICORT ) 160-4.5 MCG/ACT inhaler Inhale 2 puffs into the lungs 2 (two) times daily.   Empagliflozin (JARDIANCE PO) Take 1 tablet by mouth daily.   HYDROcodone -acetaminophen  (NORCO/VICODIN) 5-325 MG tablet 2 to 3 tablets as needed   insulin  lispro (HUMALOG) 100 UNIT/ML KwikPen Humalog KwikPen (U-100) Insulin  100 unit/mL subcutaneous  10 UNITS OR AS DIRECTED AT BEDTIME SUBCUTANEOUS 90   Insulin  Pen Needle 29G X MISC Use with insulin  pens as directed   naproxen (NAPROSYN) 375 MG tablet Take 750 mg by mouth 2 (two) times daily as needed for moderate pain (takes with hydrocodone ).    sildenafil (REVATIO) 20 MG tablet Up to 5 pills   No facility-administered encounter medications on file as of 02/07/2024.     Review of Systems  Review of Systems  N/a  Physical Exam  BP 130/74 (BP Location: Left Arm, Patient Position: Sitting, Cuff Size: Normal)   Pulse 82   Ht 5\' 10"  (1.778 m)   Wt (!) 315 lb (142.9 kg)   SpO2 95%   BMI 45.20 kg/m   Wt Readings from Last 5 Encounters:  02/07/24 (!) 315 lb (142.9 kg)  11/20/23 (!) 317 lb 6.4 oz (144 kg)  08/28/23 (!) 316 lb 3.2 oz (143.4 kg)  02/06/22 (!) 308 lb 12.8 oz (140.1 kg)  12/25/21 (!) 310 lb 4.8 oz (140.8 kg)    BMI Readings from Last 5 Encounters:  02/07/24 45.20 kg/m  11/20/23 45.54 kg/m  08/28/23 45.37 kg/m  02/06/22 44.31 kg/m   12/25/21 44.52 kg/m     Physical Exam General: Well-appearing, no acute distress Eyes: EOMI, intact Neck: Supple, no JVP appreciated sitting upright, habitus does make total evaluation difficult Pulmonary: Clear, distant Cardiovascular: Regular rate and rhythm, no murmur appreciated Abdomen: Nondistended, bowel sounds present MSK: No synovitis, no joint effusion Neuro: Normal gait, no weakness Psych: Normal mood, full affect   Assessment &  Plan:   Chronic cough: Worse at night. Denies GERD symptoms. Cough variant asthma possible.  Historically improved with low-dose Symbicort .  Worse after RSV infection.  Suspect cough variant asthma.  Chest x-ray last visit 12/24 clear.  Escalate Symbicort  to high-dose with mild improvement.  Trial of Breztri  spring 2025 without improvement.  Phenotype today, lab work.  Consideration of biologic therapy if no findings or significant cause based on cardiology workup.  Shortness of breath: Overall seems stable although describes a significant episode with extreme exertion cutting up wood, chopping a fallen tree.  He has risk factors for coronary disease.  Escalate inhalers as above.  Referral to cardiology at last visit 3 months ago, scheduled for next week.   Return in about 3 months (around 05/09/2024) for f/u Dr. Marygrace Snellen.   Guerry Leek, MD 02/07/2024

## 2024-02-07 NOTE — Patient Instructions (Signed)
 Nice to see you again  Blood work today to evaluate injection medicines to more aggressively treat asthma  Asthma can cause the cough and the chest tightness  If the cardiologist want to do additional workup we can pause on adding new medication  But if everything checks out fine I think this is the neck step  Return to clinic in 3 months or sooner as needed with Dr. Marygrace Snellen

## 2024-02-10 LAB — IGE: IgE (Immunoglobulin E), Serum: 126 kU/L — ABNORMAL HIGH (ref <=114)

## 2024-02-11 LAB — ALLERGEN PROFILE, PERENNIAL ALLERGEN IGE
Alternaria Alternata IgE: 0.76 kU/L — AB
Aspergillus Fumigatus IgE: <0.1 kU/L
Aureobasidi Pullulans IgE: 0.11 kU/L — AB
Candida Albicans IgE: <0.1 kU/L
Cat Dander IgE: 25.1 kU/L — AB
Chicken Feathers IgE: <0.1 kU/L
Cladosporium Herbarum IgE: <0.1 kU/L
Cow Dander IgE: 3.28 kU/L — AB
D Farinae IgE: <0.1 kU/L
D Pteronyssinus IgE: <0.1 kU/L
Dog Dander IgE: 2.53 kU/L — AB
Duck Feathers IgE: <0.1 kU/L
Goose Feathers IgE: <0.1 kU/L
Mouse Urine IgE: 0.78 kU/L — AB
Mucor Racemosus IgE: <0.1 kU/L
Penicillium Chrysogen IgE: <0.1 kU/L
Phoma Betae IgE: 0.23 kU/L — AB
Setomelanomma Rostrat: 0.21 kU/L — AB
Stemphylium Herbarum IgE: 0.45 kU/L — AB

## 2024-02-13 ENCOUNTER — Encounter: Payer: Self-pay | Admitting: Cardiovascular Disease

## 2024-02-13 ENCOUNTER — Ambulatory Visit: Attending: Cardiovascular Disease | Admitting: Cardiovascular Disease

## 2024-02-13 VITALS — BP 120/60 | HR 95 | Ht 70.0 in | Wt 313.0 lb

## 2024-02-13 DIAGNOSIS — R0609 Other forms of dyspnea: Secondary | ICD-10-CM | POA: Diagnosis not present

## 2024-02-13 NOTE — Progress Notes (Signed)
  Cardiology Office Note   Date:  02/13/2024  ID:  Vincent Mcintyre, DOB [birth year], MRN [id number] PCP: Darnelle Elders, PA-C  [facility] Providers Cardiologist:  None     History of Present Illness Vincent Mcintyre is a 58 y.o. male with hx of hyperlipidemia , OSA , DOE, DM , remote hx of syncope ( after biting into a very strong pickle )    He reports having severe dyspnea with extreme exertion ( ie chopping wood)   He is here to discus his dyspnea   Works around [employer] and does yard work - besides that, no regular exercise   Is on disability following a work accident / back injury   No CP   Was on ozympic but had significant GI issues .  Fam. Hx     Paternal uncle - died in 37 of massive MI     Wt is 315 lbs      ROS:   Studies Reviewed EKG Interpretation Date/Time:  Thursday February 13 2024 14:56:27 EDT Ventricular Rate:  95 PR Interval:  168 QRS Duration:  92 QT Interval:  344 QTC Calculation: 432 R Axis:   12  Text Interpretation: Normal sinus rhythm Normal ECG When compared with ECG of 07-Dec-2014 17:32, No significant change since last tracing Confirmed by Ahmad Alert ([id number]) on 02/13/2024 3:05:48 PM     Risk Assessment/Calculations           Physical Exam VS:  BP 120/60   Pulse 95   Ht 5\' 10"  (1.778 m)   Wt (!) 313 lb (142 kg)   SpO2 95%   BMI 44.91 kg/m    Wt Readings from Last 3 Encounters:  02/13/24 (!) 313 lb (142 kg)  02/07/24 (!) 315 lb (142.9 kg)  11/20/23 (!) 317 lb 6.4 oz (144 kg)    GEN: Well nourished, well developed in no acute distress NECK: No JVD; No carotid bruits CARDIAC: RRR, no murmurs, rubs, gallops RESPIRATORY:  Clear to auscultation without rales, wheezing or rhonchi  ABDOMEN: morbidly obese,  large abdominal  pannus .  He has chronic stasis changes along the lower edge of his pannus  EXTREMITIES:  No edema; No deformity   ASSESSMENT AND PLAN   DOE :    his symptoms are very atypical for coronary disease .   I suspect his DOE is due to his morbid obesity and chronic deconditioning .  I have recommended that he start riding stationary bike or doing additional cardio exercises.  Advised him to work on diet and to really restrict his carbohydrates.  I have specifically asked him to watch foods that are white, wheat, sweet.  Will get an echocardiogram for further evaluation of his cardiac function and valvular function.  This will also give us  some idea of his pulmonary pressures.  Will get a coronary calcium score as well.  Will have him follow-up with us  in 6 months.  2,   morbid obesity ;   I suspect that this is his primary issue .            Dispo: several months   Signed, Ahmad Alert, MD

## 2024-02-13 NOTE — Patient Instructions (Addendum)
 Medication Instructions:  Your physician recommends that you continue on your current medications as directed. Please refer to the Current Medication list given to you today.  *If you need a refill on your cardiac medications before your next appointment, please call your pharmacy*  Lab Work: None ordered.  If you have labs (blood work) drawn today and your tests are completely normal, you will receive your results only by: MyChart Message (if you have MyChart) OR A paper copy in the mail If you have any lab test that is abnormal or we need to change your treatment, we will call you to review the results.  Testing/Procedures: Your physician has requested that you have an echocardiogram. Echocardiography is a painless test that uses sound waves to create images of your heart. It provides your doctor with information about the size and shape of your heart and how well your heart's chambers and valves are working. This procedure takes approximately one hour. There are no restrictions for this procedure. Please do NOT wear cologne, perfume, aftershave, or lotions (deodorant is allowed). Please arrive 15 minutes prior to your appointment time.  Please note: We ask at that you not bring children with you during ultrasound (echo/ vascular) testing. Due to room size and safety concerns, children are not allowed in the ultrasound rooms during exams. Our front office staff cannot provide observation of children in our lobby area while testing is being conducted. An adult accompanying a patient to their appointment will only be allowed in the ultrasound room at the discretion of the ultrasound technician under special circumstances. We apologize for any inconvenience.  Your physician has requested that you have cardiac CT - Calcium Score.  Cardiac computed tomography (CT) is a painless test that uses an x-ray machine to take clear, detailed pictures of your heart. For further information please visit  [email or web address] Please follow instruction sheet as given.    Follow-Up: At [facility], you and your health needs are our priority.  As part of our continuing mission to provide you with exceptional heart care, our providers are all part of one team.  This team includes your primary Cardiologist (physician) and Advanced Practice Providers or APPs (Physician Assistants and Nurse Practitioners) who all work together to provide you with the care you need, when you need it.  Your next appointment:   6 months

## 2024-02-20 ENCOUNTER — Other Ambulatory Visit (HOSPITAL_COMMUNITY)

## 2024-03-31 ENCOUNTER — Ambulatory Visit (HOSPITAL_COMMUNITY)
Admission: RE | Admit: 2024-03-31 | Discharge: 2024-03-31 | Disposition: A | Discharge status: Home / Self Care | Source: Ambulatory Visit | Attending: Cardiology | Admitting: Cardiology

## 2024-03-31 DIAGNOSIS — R0609 Other forms of dyspnea: Secondary | ICD-10-CM | POA: Diagnosis not present

## 2024-03-31 LAB — ECHOCARDIOGRAM COMPLETE
Area-P 1/2: 4.86 cm2
S' Lateral: 2.6 cm

## 2024-04-02 ENCOUNTER — Ambulatory Visit: Payer: Self-pay | Admitting: Internal Medicine

## 2024-04-09 DIAGNOSIS — E1142 Type 2 diabetes mellitus with diabetic polyneuropathy: Secondary | ICD-10-CM | POA: Diagnosis not present

## 2024-04-09 DIAGNOSIS — J45909 Unspecified asthma, uncomplicated: Secondary | ICD-10-CM | POA: Diagnosis not present

## 2024-04-09 DIAGNOSIS — E1165 Type 2 diabetes mellitus with hyperglycemia: Secondary | ICD-10-CM | POA: Diagnosis not present

## 2024-05-10 DIAGNOSIS — E1142 Type 2 diabetes mellitus with diabetic polyneuropathy: Secondary | ICD-10-CM | POA: Diagnosis not present

## 2024-05-10 DIAGNOSIS — E1165 Type 2 diabetes mellitus with hyperglycemia: Secondary | ICD-10-CM | POA: Diagnosis not present

## 2024-05-10 DIAGNOSIS — J45909 Unspecified asthma, uncomplicated: Secondary | ICD-10-CM | POA: Diagnosis not present

## 2024-05-27 DIAGNOSIS — E11319 Type 2 diabetes mellitus with unspecified diabetic retinopathy without macular edema: Secondary | ICD-10-CM | POA: Diagnosis not present

## 2024-05-28 DIAGNOSIS — I10 Essential (primary) hypertension: Secondary | ICD-10-CM | POA: Diagnosis not present

## 2024-06-09 DIAGNOSIS — E1142 Type 2 diabetes mellitus with diabetic polyneuropathy: Secondary | ICD-10-CM | POA: Diagnosis not present

## 2024-06-09 DIAGNOSIS — E1165 Type 2 diabetes mellitus with hyperglycemia: Secondary | ICD-10-CM | POA: Diagnosis not present

## 2024-06-09 DIAGNOSIS — J45909 Unspecified asthma, uncomplicated: Secondary | ICD-10-CM | POA: Diagnosis not present

## 2024-07-10 DIAGNOSIS — E1142 Type 2 diabetes mellitus with diabetic polyneuropathy: Secondary | ICD-10-CM | POA: Diagnosis not present

## 2024-07-10 DIAGNOSIS — E1165 Type 2 diabetes mellitus with hyperglycemia: Secondary | ICD-10-CM | POA: Diagnosis not present

## 2024-07-10 DIAGNOSIS — J45909 Unspecified asthma, uncomplicated: Secondary | ICD-10-CM | POA: Diagnosis not present

## 2024-07-28 DIAGNOSIS — G4700 Insomnia, unspecified: Secondary | ICD-10-CM | POA: Diagnosis not present

## 2024-07-28 DIAGNOSIS — G4733 Obstructive sleep apnea (adult) (pediatric): Secondary | ICD-10-CM | POA: Diagnosis not present

## 2024-08-07 DIAGNOSIS — G4733 Obstructive sleep apnea (adult) (pediatric): Secondary | ICD-10-CM | POA: Diagnosis not present

## 2024-08-09 DIAGNOSIS — E1142 Type 2 diabetes mellitus with diabetic polyneuropathy: Secondary | ICD-10-CM | POA: Diagnosis not present

## 2024-08-09 DIAGNOSIS — E1165 Type 2 diabetes mellitus with hyperglycemia: Secondary | ICD-10-CM | POA: Diagnosis not present

## 2024-08-09 DIAGNOSIS — J45909 Unspecified asthma, uncomplicated: Secondary | ICD-10-CM | POA: Diagnosis not present

## 2024-08-14 DIAGNOSIS — Z125 Encounter for screening for malignant neoplasm of prostate: Secondary | ICD-10-CM | POA: Diagnosis not present

## 2024-08-18 DIAGNOSIS — I10 Essential (primary) hypertension: Secondary | ICD-10-CM | POA: Diagnosis not present

## 2024-08-21 DIAGNOSIS — E669 Obesity, unspecified: Secondary | ICD-10-CM | POA: Diagnosis not present
